# Patient Record
Sex: Female | Born: 1985
Health system: Southern US, Community
[De-identification: ages and names within clinical notes are randomized; demographics above are authoritative.]

## PROBLEM LIST (undated history)

## (undated) DIAGNOSIS — Z803 Family history of malignant neoplasm of breast: Secondary | ICD-10-CM

## (undated) DIAGNOSIS — Z8659 Personal history of other mental and behavioral disorders: Secondary | ICD-10-CM

## (undated) DIAGNOSIS — M9908 Segmental and somatic dysfunction of rib cage: Secondary | ICD-10-CM

---
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
## 2010-06-20 DIAGNOSIS — I8390 Asymptomatic varicose veins of unspecified lower extremity: Secondary | ICD-10-CM | POA: Insufficient documentation

## 2010-07-26 DIAGNOSIS — Z124 Encounter for screening for malignant neoplasm of cervix: Secondary | ICD-10-CM | POA: Insufficient documentation

## 2012-07-08 DIAGNOSIS — R001 Bradycardia, unspecified: Secondary | ICD-10-CM | POA: Insufficient documentation

## 2012-12-24 DIAGNOSIS — K759 Inflammatory liver disease, unspecified: Secondary | ICD-10-CM | POA: Insufficient documentation

## 2013-06-10 LAB — HIV ANTIBODY (ROUTINE TESTING W REFLEX): HIV 1&2 Ab, 4th Generation: NONREACTIVE

## 2016-04-14 LAB — HM PAP SMEAR: HM Pap smear: NORMAL

## 2019-02-13 MED FILL — SERTRALINE HCL 50 MG TABLET: 50 | 30 days supply | Qty: 30 | Fill #0

## 2019-03-20 MED FILL — SERTRALINE HCL 50 MG TABLET: 50 | 30 days supply | Qty: 30 | Fill #1

## 2019-04-26 MED FILL — SERTRALINE HCL 50 MG TABS: 50 | 30 days supply | Qty: 30 | Fill #2

## 2019-05-07 ENCOUNTER — Ambulatory Visit (INDEPENDENT_AMBULATORY_CARE_PROVIDER_SITE_OTHER): Payer: 59 | Admitting: Osteopathic Medicine

## 2019-05-07 ENCOUNTER — Encounter: Payer: Self-pay | Admitting: Osteopathic Medicine

## 2019-05-07 VITALS — BP 102/61 | HR 50 | Temp 98.3°F | Ht 64.5 in | Wt 149.7 lb

## 2019-05-07 DIAGNOSIS — Z803 Family history of malignant neoplasm of breast: Secondary | ICD-10-CM

## 2019-05-07 DIAGNOSIS — M9908 Segmental and somatic dysfunction of rib cage: Secondary | ICD-10-CM

## 2019-05-07 DIAGNOSIS — Z8659 Personal history of other mental and behavioral disorders: Secondary | ICD-10-CM | POA: Insufficient documentation

## 2019-05-07 DIAGNOSIS — D7589 Other specified diseases of blood and blood-forming organs: Secondary | ICD-10-CM | POA: Diagnosis not present

## 2019-05-07 DIAGNOSIS — Z0000 Encounter for general adult medical examination without abnormal findings: Secondary | ICD-10-CM

## 2019-05-07 HISTORY — DX: Personal history of other mental and behavioral disorders: Z86.59

## 2019-05-07 HISTORY — DX: Segmental and somatic dysfunction of rib cage: M99.08

## 2019-05-07 HISTORY — DX: Family history of malignant neoplasm of breast: Z80.3

## 2019-05-07 MED ORDER — SERTRALINE HCL 50 MG PO TABS: 50.0000 mg | ORAL_TABLET | Freq: Every day | ORAL | 3 refills | Status: DC

## 2019-05-07 NOTE — Patient Instructions (Addendum)
General Preventive Care  Most recent routine screening lipids/other labs: ordered today.   Everyone should have blood pressure checked once per year. Goal 130/80 or less.   Tobacco: don't!  Alcohol: responsible moderation is ok for most adults - if you have concerns about your alcohol intake, please talk to me!   Exercise: as tolerated to reduce risk of cardiovascular disease and diabetes. Strength training will also prevent osteoporosis.   Mental health: if need for mental health care (medicines, counseling, other), or concerns about moods, please let me know!   Sexual health: if ever need for STD testing, or if concerns with libido/pain problems, please let me know!  Advanced Directive: Living Will and/or Healthcare Power of Attorney recommended for all adults, regardless of age or health.  Vaccines  Flu vaccine: recommended for almost everyone, every fall.   Shingles vaccine: Shingrix recommended after age 75.   Pneumonia vaccines: Prevnar and Pneumovax recommended after age 92, or sooner if certain medical conditions.  Tetanus booster: Tdap recommended every 10 years. Due 12/2028 Cancer screenings   Colon cancer screening: recommended for everyone at age 62, but some folks need a colonoscopy sooner if risk factors   Breast cancer screening: mammogram recommended at age 74 every other year at least, and annually after age 75.   Cervical cancer screening: Pap every 1 to 5 years depending on age and other risk factors. Can usually stop at age 19 or w/ hysterectomy.   Lung cancer screening: not needed for non-smokers  Infection screenings . HIV, Gonorrhea/Chlamydia: screening as needed . Hepatitis C: recommended for anyone born 01-1966 . TB: certain at-risk populations, or depending on work requirements and/or travel history Other . Bone Density Test: recommended for women at age 63

## 2019-05-07 NOTE — Progress Notes (Signed)
HPI: Olivia Sutton is a 33 y.o. 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  she presents to [facility] today, 05/07/19,  for chief complaint of: New to establish care   Pleasant new patient here to establish care.  No major concerns today.  Works as a Marine scientist, employed by [employer].   Mental health: Takes sertraline 50 mg daily, in need of refill on this medication.  History of musculoskeletal chest pain, takes acetaminophen 500 mg  Well-woman care: Cisgender homosexual/lesbian, G0, married, she and wife have 1yo twins, declines STI testing, monogamous w/ partner.  Reports up-to-date on mammogram and Pap smear.       Past medical, surgical, social and family history reviewed:  Patient Active Problem List   Diagnosis Date Noted  . History of anxiety 05/07/2019  . Somatic dysfunction of rib 05/07/2019  . Family history of breast cancer 05/07/2019    History reviewed. No pertinent surgical history.  Social History   Tobacco Use  . Smoking status: Never Smoker  . Smokeless tobacco: Never Used  Substance Use Topics  . Alcohol use: Yes    Alcohol/week: 1.0 standard drinks    Types: 1 Standard drinks or equivalent per week    Family History  Problem Relation Age of Onset  . High blood pressure Father   . Diabetes Father   . Breast cancer Maternal Grandmother   . Heart attack Maternal Grandfather   . Stroke Paternal Grandfather      Current medication list and allergy/intolerance information reviewed:    Current Outpatient Medications  Medication Sig Dispense Refill  . acetaminophen (TYLENOL) 500 MG tablet Take 500 mg by mouth every 6 (six) hours as needed.    . sertraline (ZOLOFT) 50 MG tablet Take 1 tablet (50 mg total) by mouth daily. 90 tablet 3   No current facility-administered medications for this visit.      Allergies  Allergen Reactions  . Bactrim [Sulfamethoxazole-Trimethoprim] Rash    LFT elevation, fever      Review of Systems:  Constitutional:  No  fever, no chills, No recent illness, No unintentional weight changes. No significant fatigue.   HEENT: No  headache, no vision change, no hearing change, No sore throat, No  sinus pressure  Cardiac: No  chest pain, No  pressure, No palpitations, No  Orthopnea  Respiratory:  No  shortness of breath. No  Cough  Gastrointestinal: No  abdominal pain, No  nausea, No  vomiting,  No  blood in stool, No  diarrhea, No  constipation   Musculoskeletal: +myalgia/arthralgia - pain L rib cage   Skin: No  Rash, No other wounds/concerning lesions  Genitourinary: No  incontinence, No  abnormal genital bleeding, No abnormal genital discharge  Hem/Onc: No  easy bruising/bleeding, No  abnormal lymph node  Endocrine: No cold intolerance,  No heat intolerance. No polyuria/polydipsia/polyphagia   Neurologic: No  weakness, No  dizziness, No  slurred speech/focal weakness/facial droop  Psychiatric: No  concerns with depression, No  concerns with anxiety, No sleep problems, No mood problems  Exam:  BP 102/61 (BP Location: Left Arm, Patient Position: Sitting, Cuff Size: Normal)   Pulse (!) 50   Temp 98.3 F (36.8 C) (Oral)   Ht 5' 4.5" (1.638 m)   Wt 149 lb 11.2 oz (67.9 kg)   BMI 25.30 kg/m   Constitutional: VS see above. General Appearance:  alert, well-developed, well-nourished, NAD  Eyes: Normal lids and conjunctive, non-icteric sclera  Ears, Nose, Mouth, Throat: MMM, Normal external inspection ears/nares/mouth/lips/gums. TM normal bilaterally. Pharynx/tonsils no erythema, no exudate. Nasal mucosa normal.   Neck: No masses, trachea midline. No thyroid enlargement. No tenderness/mass appreciated. No lymphadenopathy  Respiratory: Normal respiratory effort. no wheeze, no rhonchi, no rales  Cardiovascular: S1/S2 normal, no murmur, no  rub/gallop auscultated. RRR. No lower extremity edema.   Gastrointestinal: Nontender, no masses. No hepatomegaly, no splenomegaly. No hernia appreciated. Bowel sounds normal. Rectal exam deferred.   Musculoskeletal: Gait normal. No clubbing/cyanosis of digits.   Neurological: Normal balance/coordination. No tremor.   Skin: warm, dry, intact. No rash/ulcer. No concerning nevi or subq nodules on limited exam.    Psychiatric: Normal judgment/insight. Normal mood and affect. Oriented x3.      ASSESSMENT/PLAN: The primary encounter diagnosis was Annual physical exam. Diagnoses of Somatic dysfunction of rib, History of anxiety, and Family history of breast cancer were also pertinent to this visit.   Orders Placed This Encounter  Procedures  . CBC  . COMPLETE METABOLIC PANEL WITH GFR  . Lipid panel  . TSH    Meds ordered this encounter  Medications  . sertraline (ZOLOFT) 50 MG tablet    Sig: Take 1 tablet (50 mg total) by mouth daily.    Dispense:  90 tablet    Refill:  3    Patient Instructions  General Preventive Care  Most recent routine screening lipids/other labs: ordered today.   Everyone should have blood pressure checked once per year. Goal 130/80 or less.   Tobacco: don't!  Alcohol: responsible moderation is ok for most adults - if you have concerns about your alcohol intake, please talk to me!   Exercise: as tolerated to reduce risk of cardiovascular disease and diabetes. Strength training will also prevent osteoporosis.   Mental health: if need for mental health care (medicines, counseling, other), or concerns about moods, please let me know!   Sexual health: if ever need for STD testing, or if concerns with libido/pain problems, please let me know!  Advanced Directive: Living Will and/or Healthcare Power of Attorney recommended for all adults, regardless of age or health.  Vaccines  Flu vaccine: recommended for almost everyone, every fall.   Shingles  vaccine: Shingrix recommended after age 37.   Pneumonia vaccines: Prevnar and Pneumovax recommended after age 43, or sooner if certain medical conditions.  Tetanus booster: Tdap recommended every 10 years. Due 12/2028 Cancer screenings   Colon cancer screening: recommended for everyone at age 71, but some folks need a colonoscopy sooner if risk factors   Breast cancer screening: mammogram recommended at age 79 every other year at least, and annually after age 63.   Cervical cancer screening: Pap every 1 to 5 years depending on age and other risk factors. Can usually stop at age 75 or w/ hysterectomy.   Lung cancer screening: not needed for non-smokers  Infection screenings . HIV, Gonorrhea/Chlamydia: screening as needed . Hepatitis C: recommended for anyone born 40-1967 . TB: certain at-risk populations, or depending on work requirements and/or travel history Other . Bone Density Test: recommended for women at age 15         Visit summary with medication list and pertinent instructions was printed for patient to review. All questions at time of visit were answered - patient instructed to contact office with any additional concerns or updates. ER/RTC precautions were reviewed with the patient.     Please  note: voice recognition software was used to produce this document, and typos may escape review. Please contact Dr. Sheppard Coil for any needed clarifications.     Follow-up plan: Return in about 1 year (around 05/06/2020) for annual physical w/ Pap, sooner if needed / as needed for osteopathic treatments .

## 2019-05-09 LAB — COMPLETE METABOLIC PANEL WITH GFR
AG Ratio: 1.9 (calc) (ref 1.0–2.5)
ALT: 9 U/L (ref 6–29)
AST: 13 U/L (ref 10–30)
Albumin: 4.4 g/dL (ref 3.6–5.1)
Alkaline phosphatase (APISO): 40 U/L (ref 31–125)
BUN: 16 mg/dL (ref 7–25)
CO2: 30 mmol/L (ref 20–32)
Calcium: 9.3 mg/dL (ref 8.6–10.2)
Chloride: 105 mmol/L (ref 98–110)
Creat: 0.69 mg/dL (ref 0.50–1.10)
GFR, Est African American: 131 mL/min/{1.73_m2} (ref >=60)
GFR, Est Non African American: 113 mL/min/{1.73_m2} (ref >=60)
Globulin: 2.3 g/dL (calc) (ref 1.9–3.7)
Glucose, Bld: 57 mg/dL — ABNORMAL LOW (ref 65–99)
Potassium: 4.2 mmol/L (ref 3.5–5.3)
Sodium: 141 mmol/L (ref 135–146)
Total Bilirubin: 0.4 mg/dL (ref 0.2–1.2)
Total Protein: 6.7 g/dL (ref 6.1–8.1)

## 2019-05-09 LAB — CBC
HCT: 39.1 % (ref 35.0–45.0)
Hemoglobin: 12.9 g/dL (ref 11.7–15.5)
MCH: 33.3 pg — ABNORMAL HIGH (ref 27.0–33.0)
MCHC: 33 g/dL (ref 32.0–36.0)
MCV: 101 fL — ABNORMAL HIGH (ref 80.0–100.0)
MPV: 11.3 fL (ref 7.5–12.5)
Platelets: 219 10*3/uL (ref 140–400)
RBC: 3.87 10*6/uL (ref 3.80–5.10)
RDW: 11.4 % (ref 11.0–15.0)
WBC: 6 10*3/uL (ref 3.8–10.8)

## 2019-05-09 LAB — LIPID PANEL
Cholesterol: 133 mg/dL (ref <200)
HDL: 57 mg/dL (ref >=50)
LDL Cholesterol (Calc): 67 mg/dL (calc)
Non-HDL Cholesterol (Calc): 76 mg/dL (calc) (ref <130)
Total CHOL/HDL Ratio: 2.3 (calc) (ref <5.0)
Triglycerides: 31 mg/dL (ref <150)

## 2019-05-09 LAB — TSH: TSH: 0.68 mIU/L

## 2019-05-09 LAB — VITAMIN B12: Vitamin B-12: 362 pg/mL (ref 200–1100)

## 2019-05-09 LAB — FOLATE: Folate: 7.9 ng/mL

## 2019-05-12 NOTE — Addendum Note (Signed)
Addended by: Maryla Morrow on: 05/12/2019 04:00 PM   Modules accepted: Orders

## 2019-06-25 MED FILL — SERTRALINE HCL 50 MG TABLET: 50 | 30 days supply | Qty: 30 | Fill #3

## 2019-07-31 MED FILL — SERTRALINE HCL 50 MG TABLET: 50 | 30 days supply | Qty: 30 | Fill #4

## 2019-08-12 ENCOUNTER — Encounter: Payer: Self-pay | Admitting: Osteopathic Medicine

## 2019-08-12 NOTE — Telephone Encounter (Signed)
Doesn't sound like anything serious but virtual visit is ok to set up.

## 2019-08-13 ENCOUNTER — Telehealth: Payer: Self-pay

## 2019-08-13 NOTE — Telephone Encounter (Signed)
Pt called requesting to r/s her appt she cancelled 08/14/19.  Pls contact pt for scheduling. Thanks.

## 2019-08-14 ENCOUNTER — Ambulatory Visit: Payer: 59 | Admitting: Osteopathic Medicine

## 2019-08-14 NOTE — Telephone Encounter (Signed)
Appointment has been canceled and a new one has been made.

## 2019-08-20 ENCOUNTER — Ambulatory Visit (INDEPENDENT_AMBULATORY_CARE_PROVIDER_SITE_OTHER): Payer: 59 | Admitting: Family Medicine

## 2019-08-20 ENCOUNTER — Encounter: Payer: Self-pay | Admitting: Family Medicine

## 2019-08-20 ENCOUNTER — Other Ambulatory Visit: Payer: Self-pay

## 2019-08-20 ENCOUNTER — Ambulatory Visit (INDEPENDENT_AMBULATORY_CARE_PROVIDER_SITE_OTHER): Payer: 59

## 2019-08-20 VITALS — BP 100/68 | HR 52 | Temp 98.5°F | Wt 148.0 lb

## 2019-08-20 DIAGNOSIS — R0789 Other chest pain: Secondary | ICD-10-CM | POA: Diagnosis not present

## 2019-08-20 DIAGNOSIS — Z23 Encounter for immunization: Secondary | ICD-10-CM

## 2019-08-20 DIAGNOSIS — R1012 Left upper quadrant pain: Secondary | ICD-10-CM

## 2019-08-20 DIAGNOSIS — R079 Chest pain, unspecified: Secondary | ICD-10-CM | POA: Diagnosis not present

## 2019-08-20 DIAGNOSIS — R05 Cough: Secondary | ICD-10-CM | POA: Diagnosis not present

## 2019-08-20 MED ORDER — OMEPRAZOLE 40 MG PO CPDR: 40.0000 mg | DELAYED_RELEASE_CAPSULE | Freq: Every day | ORAL | 3 refills | Status: DC

## 2019-08-20 MED ORDER — SUCRALFATE 1 G PO TABS: 1.0000 g | ORAL_TABLET | Freq: Four times a day (QID) | ORAL | 0 refills | Status: DC

## 2019-08-20 MED FILL — SUCRALFATE 1 GM TABLET: 1 | 30 days supply | Qty: 120 | Fill #0

## 2019-08-20 MED FILL — OMEPRAZOLE 40 MG CPDR: 40 | 90 days supply | Qty: 90 | Fill #0

## 2019-08-20 NOTE — Progress Notes (Signed)
Olivia Sutton is a 33 y.o. female who presents to [facility] today for chest and upper abdominal pain.  Patient has a 2-year history of left-sided chest pain.  She had initial work-up for this in [place].  She had chest x-ray and labs and H. pylori evaluation it was all normal.  She was thought to have costochondritis.  This was somewhat lost to follow-up.  She notes since then she has had persistent left-sided chest pain that is interfering with her ability to exercise.  She cannot run any longer and notes that it is somewhat obnoxious.  This was manageable and a bit more mild.  However about 2 weeks ago she developed worsening left-sided chest pain.  She notes the pain is now more located in the left upper quadrant of her abdomen and radiates to her scapula on the left side.  This is worse with prolonged sitting and better with motion and activity.  She is not sure if food changes her pain.  She notes that she did start a modified ketogenic diet about 4 months ago and is not sure if that has anything to do with her pain now.   She has a pertinent family history for polymyalgia rheumatica and rheumatoid arthritis in her father.  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    ROS as above:  Exam:  BP 100/68   Pulse (!) 52   Temp 98.5 F (36.9 C) (Oral)   Wt 148 lb (67.1 kg)   BMI 25.01 kg/m  Wt Readings from Last 5 Encounters:  08/20/19 148 lb (67.1 kg)  05/07/19 149 lb 11.2 oz (67.9 kg)    Gen: Well NAD HEENT: EOMI,  MMM Lungs: Normal work of breathing. CTABL Heart: Mild bradycardia no MRG Abd: NABS, Soft. Nondistended, Nontender no masses.  No rebound or guarding. Exts: Brisk capillary refill, warm and well perfused.  Chest wall nontender.  Normal scapular motion.  Lab and Radiology Results Two-view chest  x-ray images obtained today personally independently reviewed No acute infiltrates.  Relatively normal chest x-ray.  Tiny nodular density present left lung field adjacent to the left heart border.  Seen on AP and lateral view. Await formal radiology review  Twelve-lead EKG shows sinus bradycardia at 57 bpm.  No ST segment elevation or depression.  QTc 389.  Normal EKG. .    MRI CHEST WITHOUT CONTRAST12/04/2018 [facility] Result Impression  IMPRESSION: No abnormality demonstrated within the chest wall.  No abnormally-sized lymph nodes seen.  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     Dictated by: Pauline Aus MD ELECTRONICALLY SIGNED ON: 11/01/2018  Result Narrative  Patient Name: Olivia Sutton Date of Birth: 1986-03-05  Exam Date: 10/31/2018 Performed at: [facility] [id number]     EXAM: MRI CHEST WITHOUT CONTRAST  HISTORY: Left-sided chest pain.  TECHNIQUE: Coronal T1, coronal inversion recovery T2, sagittal T2, axial T1, axial STIR images are obtained. The area the patient's pain was marked seen to best advantage on axial images 12 and 13 on series 8.  COMPARISON: None available.  FINDINGS: There are no abnormally sized lymph nodes demonstrated.  Imaging of the visualized chest wall demonstrates no evidence of abnormality. No bone abnormality is demonstrated. Limited imaging of the upper abdomen demonstrates a high signal intensity lesion seen arising from the lateral aspect of the spleen  measuring 2.4 cm  in maximal dimension. This is isointense to fluid on the T1-weighted images and is likely on the basis of a splenic cyst although incompletely evaluated without intravenous contrast. Correlation with ultrasound may provide additional  information.  The limited imaging of the remainder of the upper abdomen appears unremarkable.     Assessment and Plan: 33 y.o. female with  Upper left quadrant abdominal  pain/left-sided chest pain.  Etiology at this time is unclear.  Plan for broad work-up including chest x-ray abdominal ultrasound and metabolic and rheumatologic assessment listed below.  EKG unremarkable today.  Plan to start empiric treatment with omeprazole and Carafate for possible gastritis.  Recheck back in 4 weeks.  Proceed with further work-up as needed.    Flu vaccine given today prior to discharge.  PDMP not reviewed this encounter. Orders Placed This Encounter  Procedures  . DG Chest 2 View    Order Specific Question:   Reason for exam:    Answer:   Cough, assess intra-thoracic pathology    Order Specific Question:   Is the patient pregnant?    Answer:   No    Order Specific Question:   Preferred imaging location?    Answer:   [facility]  . US Abdomen Complete    Standing Status:   Future    Standing Expiration Date:   10/19/2020    Order Specific Question:   Reason for Exam (SYMPTOM  OR DIAGNOSIS REQUIRED)    Answer:   eval LUQ ABD pain. Elevted MCV    Order Specific Question:   Preferred imaging location?    Answer:   [facility]  . Flu Vaccine QUAD 36+ mos IM  . CBC with Differential/Platelet  . Amylase  . Lipase  . COMPLETE METABOLIC PANEL WITH GFR  . Sedimentation rate  . Rheumatoid factor  . Cyclic citrul peptide antibody, IgG  . ANA  . CK  . Ambulatory referral to Physical Therapy    Referral Priority:   Routine    Referral Type:   Physical Medicine    Referral Reason:   Specialty Services Required    Requested Specialty:   Physical Therapy  . EKG 12-Lead   Meds ordered this encounter  Medications  . omeprazole (PRILOSEC) 40 MG capsule    Sig: Take 1 capsule (40 mg total) by mouth daily.    Dispense:  30 capsule    Refill:  3  . sucralfate (CARAFATE) 1 g tablet    Sig: Take 1 tablet (1 g total) by mouth 4 (four) times daily.    Dispense:  120 tablet    Refill:  0     Historical information moved to improve visibility of  documentation.  Past Medical History:  Diagnosis Date  . Family history of breast cancer 05/07/2019   Multiple relatives on maternal side, BRCA negative, pt's sister (-)BRCA   . History of anxiety 05/07/2019  . Somatic dysfunction of rib 05/07/2019   No past surgical history on file. Social History   Tobacco Use  . Smoking status: Never Smoker  . Smokeless tobacco: Never Used  Substance Use Topics  . Alcohol use: Yes    Alcohol/week: 1.0 standard drinks    Types: 1 Standard drinks or equivalent per week   family history includes Breast cancer in her maternal grandmother; Diabetes in her father; Heart attack in her maternal grandfather; High blood pressure in her father; Stroke in her paternal grandfather.  Medications: Current Outpatient Medications  Medication Sig Dispense Refill  .  acetaminophen (TYLENOL) 500 MG tablet Take 500 mg by mouth every 6 (six) hours as needed.    Marland Kitchen omeprazole (PRILOSEC) 40 MG capsule Take 1 capsule (40 mg total) by mouth daily. 30 capsule 3  . sertraline (ZOLOFT) 50 MG tablet Take 1 tablet (50 mg total) by mouth daily. 90 tablet 3  . sucralfate (CARAFATE) 1 g tablet Take 1 tablet (1 g total) by mouth 4 (four) times daily. 120 tablet 0   No current facility-administered medications for this visit.    Allergies  Allergen Reactions  . Bactrim [Sulfamethoxazole-Trimethoprim] Rash    LFT elevation, fever     Discussed warning signs or symptoms. Please see discharge instructions. Patient expresses understanding.

## 2019-08-20 NOTE — Patient Instructions (Signed)
Thank you for coming in today. Take omeprazole daily for 1 month.  Take carafate 4x daily for 1 week.  Attend PT.  Check back in 4 weeks.  Keep me updated.   Good luck with NP position.    Abdominal Pain, Adult  Many things can cause belly (abdominal) pain. Most times, belly pain is not dangerous. Many cases of belly pain can be watched and treated at home. Sometimes belly pain is serious, though. Your doctor will try to find the cause of your belly pain. Follow these instructions at home:  Take over-the-counter and prescription medicines only as told by your doctor. Do not take medicines that help you poop (laxatives) unless told to by your doctor.  Drink enough fluid to keep your pee (urine) clear or pale yellow.  Watch your belly pain for any changes.  Keep all follow-up visits as told by your doctor. This is important. Contact a doctor if:  Your belly pain changes or gets worse.  You are not hungry, or you lose weight without trying.  You are having trouble pooping (constipated) or have watery poop (diarrhea) for more than 2-3 days.  You have pain when you pee or poop.  Your belly pain wakes you up at night.  Your pain gets worse with meals, after eating, or with certain foods.  You are throwing up and cannot keep anything down.  You have a fever. Get help right away if:  Your pain does not go away as soon as your doctor says it should.  You cannot stop throwing up.  Your pain is only in areas of your belly, such as the right side or the left lower part of the belly.  You have bloody or black poop, or poop that looks like tar.  You have very bad pain, cramping, or bloating in your belly.  You have signs of not having enough fluid or water in your body (dehydration), such as: ? Dark pee, very little pee, or no pee. ? Cracked lips. ? Dry mouth. ? Sunken eyes. ? Sleepiness. ? Weakness. This information is not intended to replace advice given to you by your  health care provider. Make sure you discuss any questions you have with your health care provider. Document Released: 05/01/2008 Document Revised: 06/02/2016 Document Reviewed: 04/26/2016 Elsevier Interactive Patient Education  [employer].

## 2019-08-22 LAB — COMPLETE METABOLIC PANEL WITH GFR
AG Ratio: 1.9 (calc) (ref 1.0–2.5)
ALT: 10 U/L (ref 6–29)
AST: 13 U/L (ref 10–30)
Albumin: 4.5 g/dL (ref 3.6–5.1)
Alkaline phosphatase (APISO): 38 U/L (ref 31–125)
BUN: 15 mg/dL (ref 7–25)
CO2: 25 mmol/L (ref 20–32)
Calcium: 9.5 mg/dL (ref 8.6–10.2)
Chloride: 103 mmol/L (ref 98–110)
Creat: 0.58 mg/dL (ref 0.50–1.10)
GFR, Est African American: 138 mL/min/{1.73_m2} (ref >=60)
GFR, Est Non African American: 119 mL/min/{1.73_m2} (ref >=60)
Globulin: 2.4 g/dL (calc) (ref 1.9–3.7)
Glucose, Bld: 90 mg/dL (ref 65–99)
Potassium: 4.4 mmol/L (ref 3.5–5.3)
Sodium: 138 mmol/L (ref 135–146)
Total Bilirubin: 0.5 mg/dL (ref 0.2–1.2)
Total Protein: 6.9 g/dL (ref 6.1–8.1)

## 2019-08-22 LAB — CBC WITH DIFFERENTIAL/PLATELET
Absolute Monocytes: 500 cells/uL (ref 200–950)
Basophils Absolute: 39 cells/uL (ref 0–200)
Basophils Relative: 0.8 %
Eosinophils Absolute: 279 cells/uL (ref 15–500)
Eosinophils Relative: 5.7 %
HCT: 41 % (ref 35.0–45.0)
Hemoglobin: 13.6 g/dL (ref 11.7–15.5)
Lymphs Abs: 1475 cells/uL (ref 850–3900)
MCH: 33.1 pg — ABNORMAL HIGH (ref 27.0–33.0)
MCHC: 33.2 g/dL (ref 32.0–36.0)
MCV: 99.8 fL (ref 80.0–100.0)
MPV: 10.9 fL (ref 7.5–12.5)
Monocytes Relative: 10.2 %
Neutro Abs: 2607 cells/uL (ref 1500–7800)
Neutrophils Relative %: 53.2 %
Platelets: 257 10*3/uL (ref 140–400)
RBC: 4.11 10*6/uL (ref 3.80–5.10)
RDW: 11.6 % (ref 11.0–15.0)
Total Lymphocyte: 30.1 %
WBC: 4.9 10*3/uL (ref 3.8–10.8)

## 2019-08-22 LAB — AMYLASE: Amylase: 42 U/L (ref 21–101)

## 2019-08-22 LAB — CK: Total CK: 27 U/L — ABNORMAL LOW (ref 29–143)

## 2019-08-22 LAB — ANA: Anti Nuclear Antibody (ANA): NEGATIVE

## 2019-08-22 LAB — CYCLIC CITRUL PEPTIDE ANTIBODY, IGG: Cyclic Citrullin Peptide Ab: <16 UNITS

## 2019-08-22 LAB — LIPASE: Lipase: 11 U/L (ref 7–60)

## 2019-08-22 LAB — RHEUMATOID FACTOR: Rheumatoid fact SerPl-aCnc: <14 IU/mL (ref <14)

## 2019-08-22 LAB — SEDIMENTATION RATE: Sed Rate: 6 mm/h (ref 0–20)

## 2019-08-26 ENCOUNTER — Ambulatory Visit (INDEPENDENT_AMBULATORY_CARE_PROVIDER_SITE_OTHER): Payer: 59

## 2019-08-26 ENCOUNTER — Encounter: Payer: Self-pay | Admitting: Family Medicine

## 2019-08-26 ENCOUNTER — Other Ambulatory Visit: Payer: Self-pay

## 2019-08-26 DIAGNOSIS — R1012 Left upper quadrant pain: Secondary | ICD-10-CM

## 2019-08-26 DIAGNOSIS — Z23 Encounter for immunization: Secondary | ICD-10-CM | POA: Diagnosis not present

## 2019-08-26 DIAGNOSIS — R0789 Other chest pain: Secondary | ICD-10-CM

## 2019-08-28 ENCOUNTER — Encounter: Payer: Self-pay | Admitting: Osteopathic Medicine

## 2019-09-09 ENCOUNTER — Encounter: Payer: Self-pay | Admitting: Osteopathic Medicine

## 2019-09-17 ENCOUNTER — Ambulatory Visit (INDEPENDENT_AMBULATORY_CARE_PROVIDER_SITE_OTHER): Payer: 59 | Admitting: Osteopathic Medicine

## 2019-09-17 ENCOUNTER — Encounter: Payer: Self-pay | Admitting: Osteopathic Medicine

## 2019-09-17 ENCOUNTER — Other Ambulatory Visit: Payer: Self-pay

## 2019-09-17 VITALS — BP 105/70 | HR 51 | Temp 98.1°F | Wt 148.0 lb

## 2019-09-17 DIAGNOSIS — M9908 Segmental and somatic dysfunction of rib cage: Secondary | ICD-10-CM | POA: Diagnosis not present

## 2019-09-17 DIAGNOSIS — R0789 Other chest pain: Secondary | ICD-10-CM | POA: Diagnosis not present

## 2019-09-17 DIAGNOSIS — R1012 Left upper quadrant pain: Secondary | ICD-10-CM | POA: Diagnosis not present

## 2019-09-17 MED ORDER — SERTRALINE HCL 50 MG PO TABS: 50.0000 mg | ORAL_TABLET | Freq: Every day | ORAL | 3 refills | Status: DC

## 2019-09-17 MED FILL — SERTRALINE HCL 50 MG TABLET: 50 | 90 days supply | Qty: 90 | Fill #0

## 2019-09-17 NOTE — Progress Notes (Signed)
HPI: Olivia Sutton is a 33 y.o. 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  she presents to [facility] today, 09/17/19,  for chief complaint of:  Follow-up left-sided pain  Recently seen by a colleague of mine while I was out of the office.  There had been some issues with longstanding left-sided chest/rib pain, patient was also developing some abdominal pain as well.  See Dr. Clovis Riley note for full details, he started on omeprazole and Carafate, patient has noted improvement in symptoms though the chronic chest/rib pain still persists.  She has not yet scheduled an appointment with physical therapy for this issue   At today's visit 09/17/19 ... PMH, PSH, FH reviewed and updated as needed.  Current medication list and allergy/intolerance hx reviewed and updated as needed. (See remainder of HPI, ROS, Phys Exam below)   No results found.  No results found for this or any previous visit (from the past 72 hour(s)).        ASSESSMENT/PLAN: The primary encounter diagnosis was Atypical chest pain. Diagnoses of LUQ abdominal pain and Somatic dysfunction of rib were also pertinent to this visit.   No red flags for cardiac symptoms, symptoms have been stable for years as far as the chest/rib pain are concerned and she had responded well to some sort of chiropractic manipulations in the past.  We tried OMT at last visit which was not very helpful.  We will see if physical therapy has anything to add to this, it certainly does not sound cardiac but may consider cardiology referral just for reassurance.  Denies can discontinue the omeprazole and Carafate, can repeat this treatment in the future as needed  Patient has some time today, will walk down to physical therapy office and see if she can get herself on the schedule    Follow-up  plan: Return for ANNUAL (call week prior to visit for lab orders) no later than 04/2020, will get Pap at that visit. . See me sooner if needed!                                                 ################################################# ################################################# ################################################# #################################################    Current Meds  Medication Sig  . acetaminophen (TYLENOL) 500 MG tablet Take 500 mg by mouth every 6 (six) hours as needed.  Marland Kitchen omeprazole (PRILOSEC) 40 MG capsule Take 1 capsule (40 mg total) by mouth daily.  . sertraline (ZOLOFT) 50 MG tablet Take 1 tablet (50 mg total) by mouth daily.  . sucralfate (CARAFATE) 1 g tablet Take 1 tablet (1 g total) by mouth 4 (four) times daily.  . [DISCONTINUED] sertraline (ZOLOFT) 50 MG tablet Take 1 tablet (50 mg total) by mouth daily.    Allergies  Allergen Reactions  . Sulfamethoxazole-Trimethoprim Rash    LFT elevation, fever Drug-induced hepatitis Elevated liver enzymes, rash, fever        Review of Systems:  Constitutional: No recent illness  Cardiac: See HPI re: chest pain, No  pressure, No palpitations  Respiratory:  No  shortness of breath. No  Cough  Gastrointestinal: No  abdominal pain, no change on bowel habits  Musculoskeletal: No new myalgia/arthralgia  Neurologic: No  weakness, No  Dizziness   Exam:  BP 105/70 (BP Location: Left Arm, Patient Position: Sitting, Cuff Size: Normal)   Pulse (!) 51   Temp 98.1 F (36.7 C) (Oral)   Wt 148 lb 0.6 oz (67.2 kg)   BMI 25.02 kg/m   Constitutional: VS see above. General Appearance: alert, well-developed, well-nourished, NAD  Neck: No masses, trachea midline.   Respiratory: Normal respiratory effort. no wheeze, no rhonchi, no rales  Cardiovascular: S1/S2 normal, no murmur, no rub/gallop auscultated. RRR.   Musculoskeletal: Gait normal.  Symmetric and independent movement of all extremities.  Normal rib excursion with deep inhalation symmetrically  Abdominal: non-tender, non-distended, no appreciable organomegaly, neg Murphy's, BS WNLx4  Neurological: Normal balance/coordination. No tremor.  Skin: warm, dry, intact.   Psychiatric: Normal judgment/insight. Normal mood and affect. Oriented x3.       Visit summary with medication list and pertinent instructions was printed for patient to review, patient was advised to alert [place] if any updates are needed. All questions at time of visit were answered - patient instructed to contact office with any additional concerns. ER/RTC precautions were reviewed with the patient and understanding verbalized.   Note: Total time spent 15 minutes, greater than 50% of the visit was spent face-to-face counseling and coordinating care for the following: The primary encounter diagnosis was Atypical chest pain. Diagnoses of LUQ abdominal pain and Somatic dysfunction of rib were also pertinent to this visit.Marland Kitchen  Please note: voice recognition software was used to produce this document, and typos may escape review. Please contact Dr. Sheppard Coil for any needed clarifications.    Follow up plan: Return for ANNUAL (call week prior to visit for lab orders) no later than 04/2020, will get Pap at that visit. Marland Kitchen

## 2019-09-24 ENCOUNTER — Other Ambulatory Visit: Payer: Self-pay

## 2019-09-24 ENCOUNTER — Ambulatory Visit (INDEPENDENT_AMBULATORY_CARE_PROVIDER_SITE_OTHER): Payer: 59 | Admitting: Rehabilitative and Restorative Service Providers"

## 2019-09-24 DIAGNOSIS — M25512 Pain in left shoulder: Secondary | ICD-10-CM

## 2019-09-24 DIAGNOSIS — G8929 Other chronic pain: Secondary | ICD-10-CM

## 2019-09-24 DIAGNOSIS — R293 Abnormal posture: Secondary | ICD-10-CM | POA: Diagnosis not present

## 2019-09-24 DIAGNOSIS — M6281 Muscle weakness (generalized): Secondary | ICD-10-CM

## 2019-09-24 NOTE — Therapy (Signed)
[place], [place], [address] Phone: [phone]   Fax:  [phone]  Physical Therapy Evaluation  Patient Details  Name: Olivia Sutton MRN: [id number] Date of Birth: 1986/11/22 Referring Provider (PT): Gregor Hams, MD   Encounter Date: 09/24/2019  PT End of Session - 09/24/19 1252    Visit Number  1    Number of Visits  12    Date for PT Re-Evaluation  11/08/19    Authorization Type  [place] save plan    PT Start Time  0845    PT Stop Time  0930    PT Time Calculation (min)  45 min       Past Medical History:  Diagnosis Date  . Family history of breast cancer 05/07/2019   Multiple relatives on maternal side, BRCA negative, pt's sister (-)BRCA   . History of anxiety 05/07/2019  . Somatic dysfunction of rib 05/07/2019    No past surgical history on file.  There were no vitals filed for this visit.   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  She used to run and do 1/2 marathons, but due to pain, she is not able.  She has had some manipulations in the past of her ribs that helped temporarily.  Pain varies where it can be consistent for 2 weeks and other times it comes and goes.  When pain is increased, it does impact her sleep.  She reports onset was related to a heavy work out in plank position with lifting weights.    Patient Stated Goals  return to prior physical activity level    Currently in Pain?  Yes    Pain Score  3    goes up to 7/10   Pain Location  Rib cage    Pain Descriptors / Indicators  Aching   can get sharp with running or rigorous pain   Pain Type  Chronic pain    Pain Radiating Towards  radiates into chest and upper back; begins laterally    Pain Onset  More than a month ago    Pain Frequency  Constant    Aggravating Factors   lifting, running    Pain  Relieving Factors  rest    Effect of Pain on Daily Activities  limits daily activities-- she completes, but does note pain         [facility] PT Assessment - 09/24/19 0855      Assessment   Medical Diagnosis  R10.12 (ICD-10-CM) - LUQ abdominal pain    Referring Provider (PT)  Gregor Hams, MD    Onset Date/Surgical Date  --   July 2018   Hand Dominance  Right    Prior Therapy  none      Precautions   Precautions  None      Balance Screen   Has the patient fallen in the past 6 months  No    Has the patient had a decrease in activity level because of a fear of falling?   No    Is the patient reluctant to leave their home because of a fear of falling?   No      Home Social worker  Private residence    Living Arrangements  Spouse/significant other;Children      Prior Function   Level of Independence  Independent  Leisure  Used to run, work out more rigorous      Observation/Other Assessments   Focus on Therapeutic Outcomes (FOTO)   51%      Sensation   Light Touch  Appears Intact    Additional Comments  Gets burning sensation from rib into anterior chest and then raditating into upper back      Posture/Postural Control   Posture/Postural Control  Postural limitations      ROM / Strength   AROM / PROM / Strength  AROM;Strength      AROM   Overall AROM Comments  Has pain when abducting UEs noting pain (not tightness); R trunk rotation increases pain in L ribs radiating into chest; extension of spine dec's symptoms, R sidebending provokes a 5/10 pain in lateral L trunk region.      Strength   Overall Strength Comments  L shoulder flexion 5/5, L shoulder abduction, L elbow flexion 5/5, L elbow extension 5/5, L anterior chest *pain and 4/5, L serratus 4/5 with pain      Flexibility   Soft Tissue Assessment /Muscle Length  --      Palpation   Palpation comment  Tenderness and palpable tightness in L subscapularis, latissimus, and intercostal musculature.       Special Tests   Other special tests  Spring test throughout spine reproduces anterior chest pain in mid thoracic region.                Objective measurements completed on examination: See above findings.      [facility] Adult PT Treatment/Exercise - 09/24/19 0855      Exercises   Exercises  Shoulder      Shoulder Exercises: Prone   Other Prone Exercises  Quadriped cat/camel x 10 reps, alternating UE reaching in quadriped x 5 reps.  Attempted transition from child's pose stretch into up dog position however this was painful so modified.             PT Education - 09/24/19 1241    Education Details  HEP established    Person(s) Educated  Patient    Methods  Explanation;Demonstration;Handout    Comprehension  Verbalized understanding;Returned demonstration       PT Short Term Goals - 09/24/19 1253      PT SHORT TERM GOAL #1   Title  The patient will be indep with HEP for trunk stretching, L shoulder stability, upper back stabilization.    Time  4    Period  Weeks    Target Date  10/24/19      PT SHORT TERM GOAL #2   Title  The patient will tolerate UE weight bearing position (extended plank) x 30 seconds to demonstrate improved strength.    Time  4    Period  Weeks    Target Date  10/24/19      PT SHORT TERM GOAL #3   Title  The patient will verbalize understanding of proper body mechanics for lifting toddlers and lifting overhead.    Time  4    Period  Weeks    Target Date  10/24/19        PT Long Term Goals - 09/24/19 1256      PT LONG TERM GOAL #1   Title  The patient will be independent with progression of HEP for post d/c.    Time  6    Period  Weeks    Target Date  11/08/19      PT  LONG TERM GOAL #2   Title  The patient will tolerate AROM to 170 degrees for flexion and abduction with L shoulder without increase in pain from baseline.    Time  6    Period  Weeks    Target Date  11/08/19      PT LONG TERM GOAL #3   Title  The patient  will tolerate jogging x 10 minutes per report without increase in pain from baseline.    Time  6    Period  Weeks    Target Date  11/08/19      PT LONG TERM GOAL #4   Title  The patient will improve FOTO score from 51% to > or equal to 65% to demonstrate dec'd disability from pain.    Time  6    Period  Weeks    Target Date  11/08/19             Plan - 09/24/19 1241    Clinical Impression Statement  The patient is a 33 year old female presenting with 2+ year history of L lateral trunk pain (in region of serratus anterior) that radiates anteriorly into chest and posteriorly into upper back/shoulder blade.  She presents with pain with AROM, weakness in serratus anterior, pain with loading of the L shoulder that radiates into the chest/back, pain with thoracic spring test that radiates into the chest, and overall decrease in tolerance to lifting at home and participating in more rigorous exercise.  PT to address deficits and focus on return to wellness program as patient is able to tolerate.    Personal Factors and Comorbidities  Time since onset of injury/illness/exacerbation    Examination-Activity Limitations  Lift;Reach Overhead    Examination-Participation Restrictions  Cleaning    Stability/Clinical Decision Making  Stable/Uncomplicated    Clinical Decision Making  Low    Rehab Potential  Good    PT Frequency  2x / week    PT Duration  6 weeks    PT Treatment/Interventions  ADLs/Self Care Home Management;Cryotherapy;Electrical Stimulation;Iontophoresis 11m/ml Dexamethasone;Moist Heat;Ultrasound;Therapeutic exercise;Therapeutic activities;Neuromuscular re-education;Patient/family education;Dry needling;Passive range of motion;Manual techniques;Joint Manipulations;Spinal Manipulations    PT Next Visit Plan  Check tolerance to HEP; dry needling in L parascapular region, progression of strengthening.  Ultimate goal is to return to jogging and working out.    PT Home Exercise Plan   [email or web address]    Consulted and Agree with Plan of Care  Patient       Patient will benefit from skilled therapeutic intervention in order to improve the following deficits and impairments:  Pain, Decreased range of motion, Increased fascial restricitons, Impaired flexibility  Visit Diagnosis: Muscle weakness (generalized)  Abnormal posture  Chronic left shoulder pain     Problem List Patient Active Problem List   Diagnosis Date Noted  . History of anxiety 05/07/2019  . Somatic dysfunction of rib 05/07/2019  . Family history of breast cancer 05/07/2019    [place],PT 09/24/2019, 1:07 PM  [facility] [address]Phone: [phone]  Fax:  [phone] Name: Olivia RoddMRN: [id number] of Birth: 3Sep 26, 1987

## 2019-09-24 NOTE — Patient Instructions (Signed)
Access Code: [email or web address]  URL: [email or web address]  Date: 09/24/2019  Prepared by: Rudell Cobb   Program Notes  Begin at 1 time per day and increase as long as you are tolerating these well.   Exercises Cat-Camel - 10 reps - 2 sets - 5 seconds hold - 2x daily - 7x weekly Quadruped Alternating Arm Lift - 5 reps - 2 sets - 5 seconds hold - 2x daily - 7x weekly Patient Education Trigger Point Dry Needling

## 2019-09-29 ENCOUNTER — Encounter: Payer: 59 | Admitting: Physical Therapy

## 2019-09-30 ENCOUNTER — Ambulatory Visit (INDEPENDENT_AMBULATORY_CARE_PROVIDER_SITE_OTHER): Payer: 59 | Admitting: Physical Therapy

## 2019-09-30 ENCOUNTER — Encounter: Payer: Self-pay | Admitting: Physical Therapy

## 2019-09-30 ENCOUNTER — Other Ambulatory Visit: Payer: Self-pay

## 2019-09-30 DIAGNOSIS — M6281 Muscle weakness (generalized): Secondary | ICD-10-CM | POA: Diagnosis not present

## 2019-09-30 DIAGNOSIS — R293 Abnormal posture: Secondary | ICD-10-CM

## 2019-09-30 DIAGNOSIS — M25512 Pain in left shoulder: Secondary | ICD-10-CM

## 2019-09-30 DIAGNOSIS — G8929 Other chronic pain: Secondary | ICD-10-CM

## 2019-09-30 NOTE — Therapy (Signed)
[place], [place], [address] Phone: [phone]   Fax:  [phone]  Physical Therapy Treatment  Patient Details  Name: Olivia Sutton MRN: [id number] Date of Birth: May 18, 1986 Referring Provider (PT): Gregor Hams, MD   Encounter Date: 09/30/2019  PT End of Session - 09/30/19 0849    Visit Number  2    Number of Visits  12    Date for PT Re-Evaluation  11/08/19    Authorization Type  [place] save plan    PT Start Time  0848    PT Stop Time  0929    PT Time Calculation (min)  41 min    Activity Tolerance  Patient tolerated treatment well    Behavior During Therapy  [facility] for tasks assessed/performed       Past Medical History:  Diagnosis Date  . Family history of breast cancer 05/07/2019   Multiple relatives on maternal side, BRCA negative, pt's sister (-)BRCA   . History of anxiety 05/07/2019  . Somatic dysfunction of rib 05/07/2019    History reviewed. No pertinent surgical history.  There were no vitals filed for this visit.  Subjective Assessment - 09/30/19 0851    Subjective  Haven't been lifting my kids much over the past few days because I've been working. Pain is constant. Tolerable today.    Patient Stated Goals  return to prior physical activity level    Currently in Pain?  Yes    Pain Score  3     Pain Location  Rib cage         [facility] PT Assessment - 09/30/19 0001      Palpation   Palpation comment  left intercostal rib pain T8-10                    [facility] Adult PT Treatment/Exercise - 09/30/19 0001      Shoulder Exercises: ROM/Strengthening   UBE (Upper Arm Bike)  --      Manual Therapy   Manual Therapy  Soft tissue mobilization;Taping    Manual therapy comments  skilled palpation and monitoring of soft tissue during DN    Soft tissue mobilization  to muscles surrounding left shoulder girdle and biceps    Kinesiotex  Ligament Correction      Kinesiotix   Ligament Correction  Dynamic taping to left rib cage in SDLY; taped on exhale       Trigger Point Dry Needling - 09/30/19 0001    Consent Given?  Yes    Education Handout Provided  Previously provided    Muscles Treated Head and Neck  Upper trapezius    Muscles Treated Upper Quadrant  Pectoralis major;Pectoralis minor;Infraspinatus;Subscapularis;Deltoid;Latissimus dorsi;Teres major    Other Dry Needling  serratus ant    Upper Trapezius Response  Twitch reponse elicited;Palpable increased muscle length    Pectoralis Major Response  Twitch response elicited;Palpable increased muscle length    Pectoralis Minor Response  Twitch response elicited;Palpable increased muscle length    Infraspinatus Response  Twitch response elicited;Palpable increased muscle length    Subscapularis Response  Twitch response elicited;Palpable increased muscle length    Deltoid Response  Twitch response elicited;Palpable increased muscle length    Latissimus dorsi Response  Twitch response elicited;Palpable increased muscle length    Teres major Response  Twitch response elicited;Palpable increased muscle length           PT Education - 09/30/19 1844    Education  Details  DN education and aftercare; dynamic tape education; discussed slipping rib syndrome as possibility for pain    Person(s) Educated  Patient    Methods  Explanation    Comprehension  Verbalized understanding       PT Short Term Goals - 09/24/19 1253      PT SHORT TERM GOAL #1   Title  The patient will be indep with HEP for trunk stretching, L shoulder stability, upper back stabilization.    Time  4    Period  Weeks    Target Date  10/24/19      PT SHORT TERM GOAL #2   Title  The patient will tolerate UE weight bearing position (extended plank) x 30 seconds to demonstrate improved strength.    Time  4    Period  Weeks    Target Date  10/24/19      PT SHORT TERM GOAL #3   Title  The patient will verbalize  understanding of proper body mechanics for lifting toddlers and lifting overhead.    Time  4    Period  Weeks    Target Date  10/24/19        PT Long Term Goals - 09/24/19 1256      PT LONG TERM GOAL #1   Title  The patient will be independent with progression of HEP for post d/c.    Time  6    Period  Weeks    Target Date  11/08/19      PT LONG TERM GOAL #2   Title  The patient will tolerate AROM to 170 degrees for flexion and abduction with L shoulder without increase in pain from baseline.    Time  6    Period  Weeks    Target Date  11/08/19      PT LONG TERM GOAL #3   Title  The patient will tolerate jogging x 10 minutes per report without increase in pain from baseline.    Time  6    Period  Weeks    Target Date  11/08/19      PT LONG TERM GOAL #4   Title  The patient will improve FOTO score from 51% to > or equal to 65% to demonstrate dec'd disability from pain.    Time  6    Period  Weeks    Target Date  11/08/19            Plan - 09/30/19 1904    Clinical Impression Statement  Patient tolerated DN very well to left upper quadrant today. Discussed possibility of slipping rib syndrome although patient demos significant winging in left scapula as well. Dynamic tape was applied to left rib cage and pt plans to try running with the tape. DN may be beneficial to left intercostals as well.    PT Treatment/Interventions  ADLs/Self Care Home Management;Cryotherapy;Electrical Stimulation;Iontophoresis 27m/ml Dexamethasone;Moist Heat;Ultrasound;Therapeutic exercise;Therapeutic activities;Neuromuscular re-education;Patient/family education;Dry needling;Passive range of motion;Manual techniques;Joint Manipulations;Spinal Manipulations    PT Next Visit Plan  Check tolerance to HEP; assess dry needling in L parascapular region and taping; DN possibly to intercostals;  progression of strengthening.  Ultimate goal is to return to jogging and working out.    [place]      Patient will benefit from skilled therapeutic intervention in order to improve the following deficits and impairments:  Pain, Decreased range of motion, Increased fascial restricitons, Impaired flexibility  Visit Diagnosis: Muscle weakness (  generalized)  Abnormal posture  Chronic left shoulder pain     Problem List Patient Active Problem List   Diagnosis Date Noted  . History of anxiety 05/07/2019  . Somatic dysfunction of rib 05/07/2019  . Family history of breast cancer 05/07/2019    Madelyn Flavors PT 09/30/2019, 7:10 PM  [facility] [place], [place], [address] Phone: [phone]   Fax:  [phone]  Name: Olivia Sutton MRN: [id number] Date of Birth: Apr 05, 1986

## 2019-10-08 ENCOUNTER — Other Ambulatory Visit: Payer: Self-pay

## 2019-10-08 ENCOUNTER — Encounter: Payer: Self-pay | Admitting: Physical Therapy

## 2019-10-08 ENCOUNTER — Ambulatory Visit (INDEPENDENT_AMBULATORY_CARE_PROVIDER_SITE_OTHER): Payer: 59 | Admitting: Physical Therapy

## 2019-10-08 DIAGNOSIS — G8929 Other chronic pain: Secondary | ICD-10-CM | POA: Diagnosis not present

## 2019-10-08 DIAGNOSIS — M6281 Muscle weakness (generalized): Secondary | ICD-10-CM

## 2019-10-08 DIAGNOSIS — M25512 Pain in left shoulder: Secondary | ICD-10-CM

## 2019-10-08 DIAGNOSIS — R293 Abnormal posture: Secondary | ICD-10-CM

## 2019-10-08 NOTE — Therapy (Addendum)
[place], [place], [address] Phone: [phone]   Fax:  [phone]  Physical Therapy Treatment and D/C summary  Patient Details  Name: Olivia Sutton MRN: [id number] Date of Birth: [birth year] Referring Provider (PT): Gregor Hams, MD   Encounter Date: 10/08/2019  PT End of Session - 10/08/19 0850    Visit Number  3    Number of Visits  12    Date for PT Re-Evaluation  11/08/19    PT Start Time  0850    PT Stop Time  0936    PT Time Calculation (min)  46 min    Activity Tolerance  Patient tolerated treatment well    Behavior During Therapy  [facility] for tasks assessed/performed       Past Medical History:  Diagnosis Date  . Family history of breast cancer 05/07/2019   Multiple relatives on maternal side, BRCA negative, pt's sister (-)BRCA   . History of anxiety 05/07/2019  . Somatic dysfunction of rib 05/07/2019    History reviewed. No pertinent surgical history.  There were no vitals filed for this visit.  Subjective Assessment - 10/08/19 0855    Subjective  Reallly sore the next day but since then have felt good.    Patient Stated Goals  return to prior physical activity level    Currently in Pain?  No/denies                       [facility] Adult PT Treatment/Exercise - 10/08/19 0001      Shoulder Exercises: Supine   Protraction  Left;20 reps      Shoulder Exercises: Prone   Horizontal ABduction 1  Left;15 reps    Horizontal ABduction 2  15 reps;Left    Other Prone Exercises  Quadriped cat/camel x 10 reps, alternating UE reaching in quadriped x 10 reps.  Down dog x 10 sec hold    Other Prone Exercises  quad throacic rotation x 5 each, then left with bent arm x 5      Shoulder Exercises: Sidelying   External Rotation  Left;10 reps      Manual Therapy   Manual Therapy  Soft tissue mobilization    Manual therapy comments  skilled palpation and monitoring of soft tissue  during DN    Soft tissue mobilization  to left infraspinatus, teres major, UT and rhomboids    Kinesiotex  Ligament Correction      Kinesiotix   Ligament Correction  Dynamic taping to left rib cage in Standing; taped on exhale       Trigger Point Dry Needling - 10/08/19 0001    Consent Given?  Yes    Education Handout Provided  Previously provided    Muscles Treated Head and Neck  Upper trapezius    Muscles Treated Upper Quadrant  Pectoralis major;Pectoralis minor;Infraspinatus;Teres major;Rhomboids;Latissimus dorsi;Subscapularis    Dry Needling Comments  left    Upper Trapezius Response  Twitch reponse elicited;Palpable increased muscle length    Pectoralis Major Response  Twitch response elicited;Palpable increased muscle length    Pectoralis Minor Response  Twitch response elicited;Palpable increased muscle length    Rhomboids Response  Palpable increased muscle length    Infraspinatus Response  Twitch response elicited;Palpable increased muscle length    Subscapularis Response  Twitch response elicited;Palpable increased muscle length    Latissimus dorsi Response  Palpable increased muscle length    Teres major Response  Twitch response elicited  PT Education - 10/08/19 1948    Education Details  HEP Progressed    Person(s) Educated  Patient    Methods  Explanation;Demonstration;Handout    Comprehension  Verbalized understanding;Returned demonstration       PT Short Term Goals - 09/24/19 1253      PT SHORT TERM GOAL #1   Title  The patient will be indep with HEP for trunk stretching, L shoulder stability, upper back stabilization.    Time  4    Period  Weeks    Target Date  10/24/19      PT SHORT TERM GOAL #2   Title  The patient will tolerate UE weight bearing position (extended plank) x 30 seconds to demonstrate improved strength.    Time  4    Period  Weeks    Target Date  10/24/19      PT SHORT TERM GOAL #3   Title  The patient will verbalize  understanding of proper body mechanics for lifting toddlers and lifting overhead.    Time  4    Period  Weeks    Target Date  10/24/19        PT Long Term Goals - 09/24/19 1256      PT LONG TERM GOAL #1   Title  The patient will be independent with progression of HEP for post d/c.    Time  6    Period  Weeks    Target Date  11/08/19      PT LONG TERM GOAL #2   Title  The patient will tolerate AROM to 170 degrees for flexion and abduction with L shoulder without increase in pain from baseline.    Time  6    Period  Weeks    Target Date  11/08/19      PT LONG TERM GOAL #3   Title  The patient will tolerate jogging x 10 minutes per report without increase in pain from baseline.    Time  6    Period  Weeks    Target Date  11/08/19      PT LONG TERM GOAL #4   Title  The patient will improve FOTO score from 51% to > or equal to 65% to demonstrate dec'd disability from pain.    Time  6    Period  Weeks    Target Date  11/08/19            Plan - 10/08/19 1949    Clinical Impression Statement  Patient presents with decreased pain since last visit. She liked the dynamic tape but did not try running yet. She tolerated new exercises today without increased pain. Still tender along ribs but less than last visit. Good response to DN again today.    PT Frequency  2x / week    PT Duration  6 weeks    PT Treatment/Interventions  ADLs/Self Care Home Management;Cryotherapy;Electrical Stimulation;Iontophoresis 38m/ml Dexamethasone;Moist Heat;Ultrasound;Therapeutic exercise;Therapeutic activities;Neuromuscular re-education;Patient/family education;Dry needling;Passive range of motion;Manual techniques;Joint Manipulations;Spinal Manipulations    PT Next Visit Plan  Progress TE as tolerated; assess dry needling in L parascapular region and taping; DN possibly to intercostals;  Ultimate goal is to return to jogging and working out.    PT Home Exercise Plan  [email or web address]    Consulted and Agree  with Plan of Care  Patient       Patient will benefit from skilled therapeutic intervention in order to improve the following deficits and impairments:  Pain, Decreased range  of motion, Increased fascial restricitons, Impaired flexibility  Visit Diagnosis: Muscle weakness (generalized)  Abnormal posture  Chronic left shoulder pain     Problem List Patient Active Problem List   Diagnosis Date Noted  . History of anxiety 05/07/2019  . Somatic dysfunction of rib 05/07/2019  . Family history of breast cancer 05/07/2019    Madelyn Flavors PT 10/08/2019, 7:54 PM  [facility] [place], [place], [address] Phone: [phone]   Fax:  [phone]  Name: Addy Mcmannis MRN: [id number] Date of Birth: 1986-08-31  PHYSICAL THERAPY DISCHARGE SUMMARY  Visits from Start of Care: 3  Current functional level related to goals / functional outcomes: See above    Remaining deficits: See above    Education / Equipment: HEP  Plan: Patient agrees to discharge.  Patient goals were not met. Patient is being discharged due to not returning since the last visit.  ?????     Madelyn Flavors, PT 12/08/19 8:08 AM  [facility] [place], [place] [address]  [phone] (office) [phone] (fax)

## 2019-10-08 NOTE — Patient Instructions (Signed)
Access Code: [email or web address]  URL: [email or web address]  Date: 10/08/2019  Prepared by: Madelyn Flavors   Program Notes  Begin at 1 time per day and increase as long as you are tolerating these well.   Exercises Cat-Camel - 10 reps - 2 sets - 5 seconds hold - 2x daily - 7x weekly Quadruped Alternating Arm Lift - 5 reps - 2 sets - 5 seconds hold - 2x daily - 7x weekly Prone Single Arm Shoulder Horizontal Abduction with Dumbbell - Palm Down - 10 reps - 3 sets - 1x daily - 7x weekly Prone Single Arm Shoulder Y with Dumbbell - 10 reps - 3 sets - 1x daily - 7x weekly Prone Shoulder Extension - Single Arm with Dumbbell - 10 reps - 3 sets - 1x daily - 7x weekly Quadruped Thoracic Rotation with Hand on Low Back - 10 reps - 3 sets - 1x daily - 7x weekly Patient Education Trigger Point Dry Needling

## 2019-10-21 ENCOUNTER — Telehealth: Payer: 59

## 2019-10-21 ENCOUNTER — Encounter: Payer: 59 | Admitting: Physical Therapy

## 2019-12-17 MED FILL — SERTRALINE HCL 50 MG TABLET: 50 | 90 days supply | Qty: 90 | Fill #1

## 2020-03-25 MED FILL — SERTRALINE HCL 50 MG TABS: 50 | 90 days supply | Qty: 90 | Fill #2

## 2020-04-13 ENCOUNTER — Encounter: Payer: Self-pay | Admitting: Osteopathic Medicine

## 2020-04-13 ENCOUNTER — Ambulatory Visit (INDEPENDENT_AMBULATORY_CARE_PROVIDER_SITE_OTHER): Payer: 59 | Admitting: Osteopathic Medicine

## 2020-04-13 ENCOUNTER — Other Ambulatory Visit (HOSPITAL_COMMUNITY)
Admission: RE | Admit: 2020-04-13 | Discharge: 2020-04-13 | Disposition: A | Discharge status: Home / Self Care | Payer: 59 | Source: Ambulatory Visit | Attending: Osteopathic Medicine | Admitting: Osteopathic Medicine

## 2020-04-13 VITALS — BP 102/62 | HR 62 | Temp 98.3°F | Wt 154.0 lb

## 2020-04-13 DIAGNOSIS — Z8659 Personal history of other mental and behavioral disorders: Secondary | ICD-10-CM | POA: Diagnosis not present

## 2020-04-13 DIAGNOSIS — Z0000 Encounter for general adult medical examination without abnormal findings: Secondary | ICD-10-CM

## 2020-04-13 DIAGNOSIS — Z124 Encounter for screening for malignant neoplasm of cervix: Secondary | ICD-10-CM | POA: Diagnosis not present

## 2020-04-13 DIAGNOSIS — N841 Polyp of cervix uteri: Secondary | ICD-10-CM

## 2020-04-13 NOTE — Progress Notes (Signed)
Olivia Sutton is a 34 y.o. female who presents to  [place] at [facility]  today, 04/13/20, seeking care for the following:    Patient here for annual physical / wellness exam.  See preventive care reviewed as below.  Recent labs reviewed in detail with the patient.   Additional concerns today include:  None    ASSESSMENT & PLAN with other pertinent history/findings:  The primary encounter diagnosis was Annual physical exam. Diagnoses of History of anxiety, Cervical cancer screening, and Cervical polyp were also pertinent to this visit.    Patient Instructions  General Preventive Care  Most recent routine screening labs: ordered.   Everyone should have blood pressure checked once per year. Goal 130/80 or less.   Tobacco: don't!   Alcohol: responsible moderation is ok for most adults - if you have concerns about your alcohol intake, please talk to me!   Exercise: as tolerated to reduce risk of cardiovascular disease and diabetes. Strength training will also prevent osteoporosis.   Mental health: if need for mental health care (medicines, counseling, other), or concerns about moods, please let me know!   Sexual health: if need for STD testing, or if concerns with libido/pain problems, please let me know! If you need to discuss family planning options, please let me know!   Advanced Directive: Living Will and/or Healthcare Power of Attorney recommended for all adults, regardless of age or health.  Vaccines  Flu vaccine: for almost everyone, every fall.   Shingles vaccine: after age 60.   Pneumonia vaccines: after age 85  Tetanus booster: every 10 years  COVID vaccine: thanks for getting your vaccine!  Cancer screenings   Colon cancer screening: for everyone age 74-75.   Breast cancer screening: mammogram at age 33   Cervical cancer screening: Pap every 1 to 5 years depending on age and other risk factors. Can  usually stop at age 91 or w/ hysterectomy.   Lung cancer screening: not needed for non-smokers  Infection screenings  . HIV: recommended screening at least once age 51-65, more often as needed. . Gonorrhea/Chlamydia: screening as needed. . Hepatitis C: recommended once for everyone age 42-75 . TB: certain at-risk populations Other . Bone Density Test: recommended for women at age 40    Orders Placed This Encounter  Procedures  . CBC  . COMPLETE METABOLIC PANEL WITH GFR  . Lipid panel    No orders of the defined types were placed in this encounter.      Follow-up instructions: Return in about 1 year (around 04/13/2021) for [place] (call week prior to visit for lab orders).                                         BP 102/62 (BP Location: Left Arm, Patient Position: Sitting, Cuff Size: Normal)   Pulse 62   Temp 98.3 F (36.8 C) (Oral)   Wt 154 lb 0.6 oz (69.9 kg)   BMI 26.03 kg/m   Current Meds  Medication Sig  . acetaminophen (TYLENOL) 500 MG tablet Take 500 mg by mouth every 6 (six) hours as needed.  . sertraline (ZOLOFT) 50 MG tablet Take 1 tablet (50 mg total) by mouth daily.    No results found for this or any previous visit (from the past 72 hour(s)).  No results found.  Depression screen [facility] 2/9 04/13/2020 05/07/2019  Decreased Interest 0 0  Down, Depressed, Hopeless 0 0  PHQ - 2 Score 0 0  Altered sleeping 0 0  Tired, decreased energy 0 0  Change in appetite 0 0  Feeling bad or failure about yourself  0 0  Trouble concentrating 0 0  Moving slowly or fidgety/restless 0 0  Suicidal thoughts 0 0  PHQ-9 Score 0 0  Difficult doing work/chores Not difficult at all Not difficult at all    GAD 7 : Generalized Anxiety Score 04/13/2020 05/07/2019  Nervous, Anxious, on Edge 1 0  Control/stop worrying 1 0  Worry too much - different things 1 0  Trouble relaxing 1 0  Restless 0 0  Easily annoyed or irritable 0 0  Afraid -  awful might happen 1 0  Total GAD 7 Score 5 0  Anxiety Difficulty Not difficult at all Not difficult at all    Constitutional:  . VSS, see nurse notes . General Appearance: alert, well-developed, well-nourished, NAD Eyes: Marland Kitchen Normal lids and conjunctive, non-icteric sclera . PERRLA Ears, Nose, Mouth, Throat: . Normal appearance . Normal external auditory canal and TM bilaterally . MMM, posterior pharynx without erythema/exudate Neck: . No masses, trachea midline . No thyroid enlargement/tenderness/mass appreciated Respiratory: . Normal respiratory effort . No dullness/hyper-resonance to percussion . Breath sounds normal, no wheeze/rhonchi/rales Cardiovascular: . S1/S2 normal, no murmur/rub/gallop auscultated . No carotid bruit or JVD . Pedal pulse II/IV bilaterally DP and PT . No lower extremity edema Gastrointestinal: . Nontender, no masses . No hepatomegaly, no splenomegaly . No hernia appreciated Musculoskeletal:  . Gait normal . No clubbing/cyanosis of digits Neurological: . No cranial nerve deficit on limited exam . Motor and sensation intact and symmetric Psychiatric: . Normal judgment/insight . Normal mood and affect GYN: No lesions/ulcers to external genitalia, normal urethra, normal vaginal mucosa, physiologic discharge, cervix normal except fairly sizable polyp - grasp w/ ring forceps and attempted to remove but w/ twisting and gentle traction was unable to make any progress, uterus not enlarged or tender, adnexa no masses and nontender   All questions at time of visit were answered - patient instructed to contact office with any additional concerns or updates.  ER/RTC precautions were reviewed with the patient.  Please note: voice recognition software was used to produce this document, and typos may escape review. Please contact Dr. Sheppard Coil for any needed clarifications.

## 2020-04-13 NOTE — Patient Instructions (Addendum)
General Preventive Care  Most recent routine screening labs: ordered.   Everyone should have blood pressure checked once per year. Goal 130/80 or less.   Tobacco: don't!   Alcohol: responsible moderation is ok for most adults - if you have concerns about your alcohol intake, please talk to me!   Exercise: as tolerated to reduce risk of cardiovascular disease and diabetes. Strength training will also prevent osteoporosis.   Mental health: if need for mental health care (medicines, counseling, other), or concerns about moods, please let me know!   Sexual health: if need for STD testing, or if concerns with libido/pain problems, please let me know! If you need to discuss family planning options, please let me know!   Advanced Directive: Living Will and/or Healthcare Power of Attorney recommended for all adults, regardless of age or health.  Vaccines  Flu vaccine: for almost everyone, every fall.   Shingles vaccine: after age 32.   Pneumonia vaccines: after age 31  Tetanus booster: every 10 years  COVID vaccine: thanks for getting your vaccine!  Cancer screenings   Colon cancer screening: for everyone age 40-75.   Breast cancer screening: mammogram at age 32   Cervical cancer screening: Pap every 1 to 5 years depending on age and other risk factors. Can usually stop at age 68 or w/ hysterectomy.   Lung cancer screening: not needed for non-smokers  Infection screenings  . HIV: recommended screening at least once age 48-65, more often as needed. . Gonorrhea/Chlamydia: screening as needed. . Hepatitis C: recommended once for everyone age 77-75 . TB: certain at-risk populations Other . Bone Density Test: recommended for women at age 52

## 2020-04-15 DIAGNOSIS — Z8659 Personal history of other mental and behavioral disorders: Secondary | ICD-10-CM | POA: Diagnosis not present

## 2020-04-15 DIAGNOSIS — Z0000 Encounter for general adult medical examination without abnormal findings: Secondary | ICD-10-CM | POA: Diagnosis not present

## 2020-04-15 LAB — CYTOLOGY - PAP
Comment: NEGATIVE
Diagnosis: NEGATIVE
High risk HPV: NEGATIVE

## 2020-04-15 LAB — CBC
HCT: 38.9 % (ref 35.0–45.0)
Hemoglobin: 13.1 g/dL (ref 11.7–15.5)
MCH: 33.3 pg — ABNORMAL HIGH (ref 27.0–33.0)
MCHC: 33.7 g/dL (ref 32.0–36.0)
MCV: 99 fL (ref 80.0–100.0)
MPV: 10.6 fL (ref 7.5–12.5)
Platelets: 241 10*3/uL (ref 140–400)
RBC: 3.93 10*6/uL (ref 3.80–5.10)
RDW: 11.6 % (ref 11.0–15.0)
WBC: 5.8 10*3/uL (ref 3.8–10.8)

## 2020-04-15 LAB — COMPLETE METABOLIC PANEL WITH GFR
AG Ratio: 1.8 (calc) (ref 1.0–2.5)
ALT: 14 U/L (ref 6–29)
AST: 14 U/L (ref 10–30)
Albumin: 4.2 g/dL (ref 3.6–5.1)
Alkaline phosphatase (APISO): 38 U/L (ref 31–125)
BUN: 14 mg/dL (ref 7–25)
CO2: 29 mmol/L (ref 20–32)
Calcium: 9.3 mg/dL (ref 8.6–10.2)
Chloride: 104 mmol/L (ref 98–110)
Creat: 0.75 mg/dL (ref 0.50–1.10)
GFR, Est African American: 119 mL/min/{1.73_m2} (ref >=60)
GFR, Est Non African American: 103 mL/min/{1.73_m2} (ref >=60)
Globulin: 2.4 g/dL (calc) (ref 1.9–3.7)
Glucose, Bld: 84 mg/dL (ref 65–99)
Potassium: 4.7 mmol/L (ref 3.5–5.3)
Sodium: 138 mmol/L (ref 135–146)
Total Bilirubin: 0.5 mg/dL (ref 0.2–1.2)
Total Protein: 6.6 g/dL (ref 6.1–8.1)

## 2020-04-15 LAB — LIPID PANEL
Cholesterol: 126 mg/dL (ref <200)
HDL: 45 mg/dL — ABNORMAL LOW (ref >=50)
LDL Cholesterol (Calc): 73 mg/dL (calc)
Non-HDL Cholesterol (Calc): 81 mg/dL (calc) (ref <130)
Total CHOL/HDL Ratio: 2.8 (calc) (ref <5.0)
Triglycerides: 27 mg/dL (ref <150)

## 2020-05-17 ENCOUNTER — Ambulatory Visit (INDEPENDENT_AMBULATORY_CARE_PROVIDER_SITE_OTHER): Payer: 59 | Admitting: Obstetrics & Gynecology

## 2020-05-17 ENCOUNTER — Other Ambulatory Visit: Payer: Self-pay

## 2020-05-17 ENCOUNTER — Encounter: Payer: Self-pay | Admitting: Obstetrics & Gynecology

## 2020-05-17 VITALS — BP 99/59 | HR 52 | Resp 16 | Ht 64.5 in | Wt 152.0 lb

## 2020-05-17 DIAGNOSIS — N86 Erosion and ectropion of cervix uteri: Secondary | ICD-10-CM

## 2020-05-17 NOTE — Progress Notes (Signed)
   Subjective:    Patient ID: Olivia Sutton, female    DOB: 09-21-1986, 34 y.o.   MRN: [id number]  HPI  Pt presents for cervical polyp form PCP.  Attempt was made at PCP office to remove and documented as unsuccessful.  Pt bled for 10 days post attempt.  NO bleeding recently.  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  Review of Systems  Constitutional: Negative.   Respiratory: Negative.   Cardiovascular: Negative.   Gastrointestinal: Negative.   Genitourinary: Negative.        Objective:   Physical Exam Vitals reviewed.  Constitutional:      General: She is not in acute distress.    Appearance: She is well-developed.  HENT:     Head: Normocephalic and atraumatic.  Eyes:     Conjunctiva/sclera: Conjunctivae normal.  Cardiovascular:     Rate and Rhythm: Normal rate.  Pulmonary:     Effort: Pulmonary effort is normal.  Genitourinary:    Comments: ectropion visible.  No polyp. Bimanual normal Skin:    General: Skin is warm and dry.  Neurological:     Mental Status: She is alert and oriented to person, place, and time.            Assessment & Plan:  34 yo female with ectropion.  Cervical polyp no longer present.  Most likely detached between PCP visit and Gyn visit.  Pt re assured.  All questions answered.

## 2020-06-25 MED FILL — SERTRALINE HCL 50 MG TABS: 50 | 90 days supply | Qty: 90 | Fill #3

## 2020-07-15 DIAGNOSIS — Z03818 Encounter for observation for suspected exposure to other biological agents ruled out: Secondary | ICD-10-CM | POA: Diagnosis not present

## 2020-07-15 DIAGNOSIS — Z20822 Contact with and (suspected) exposure to covid-19: Secondary | ICD-10-CM | POA: Diagnosis not present

## 2020-09-30 ENCOUNTER — Ambulatory Visit (INDEPENDENT_AMBULATORY_CARE_PROVIDER_SITE_OTHER): Payer: 59

## 2020-09-30 ENCOUNTER — Ambulatory Visit (INDEPENDENT_AMBULATORY_CARE_PROVIDER_SITE_OTHER): Payer: 59 | Admitting: Nurse Practitioner

## 2020-09-30 ENCOUNTER — Encounter: Payer: Self-pay | Admitting: Nurse Practitioner

## 2020-09-30 ENCOUNTER — Other Ambulatory Visit: Payer: Self-pay

## 2020-09-30 ENCOUNTER — Other Ambulatory Visit: Payer: Self-pay | Admitting: Nurse Practitioner

## 2020-09-30 VITALS — BP 99/62 | HR 51 | Temp 98.0°F | Ht 64.5 in | Wt 159.4 lb

## 2020-09-30 DIAGNOSIS — M7121 Synovial cyst of popliteal space [Baker], right knee: Secondary | ICD-10-CM

## 2020-09-30 DIAGNOSIS — M67969 Unspecified disorder of synovium and tendon, unspecified lower leg: Secondary | ICD-10-CM | POA: Insufficient documentation

## 2020-09-30 DIAGNOSIS — M25461 Effusion, right knee: Secondary | ICD-10-CM | POA: Diagnosis not present

## 2020-09-30 DIAGNOSIS — M6790 Unspecified disorder of synovium and tendon, unspecified site: Secondary | ICD-10-CM | POA: Diagnosis not present

## 2020-09-30 MED ORDER — MELOXICAM 15 MG PO TABS: 15.0000 mg | ORAL_TABLET | Freq: Every day | ORAL | 3 refills | Status: DC

## 2020-09-30 MED FILL — MELOXICAM 15 MG TABLET: 15 | 30 days supply | Qty: 30 | Fill #0

## 2020-09-30 NOTE — Assessment & Plan Note (Signed)
Symptoms and presentation consistent with semimembranous tendinopathy of the right lower extremity.  See recommendations for Bakers Cyst above.

## 2020-09-30 NOTE — Patient Instructions (Signed)
Baker Cyst  A Baker cyst, also called a popliteal cyst, is a growth that forms at the back of the knee. The cyst forms when the fluid-filled sac (bursa) that cushions the knee joint becomes enlarged. What are the causes? In most cases, a Baker cyst results from another knee problem that causes swelling inside the knee. This makes the fluid inside the knee joint (synovial fluid) flow into the bursa behind the knee, causing the bursa to enlarge. What increases the risk? You may be more likely to develop a Baker cyst if you already have a knee problem, such as:  A tear in cartilage that cushions the knee joint (meniscal tear).  A tear in the tissues that connect the bones of the knee joint (ligament tear).  Knee swelling from osteoarthritis, rheumatoid arthritis, or gout. What are the signs or symptoms? The main symptom of this condition is a lump behind the knee. This may be the only symptom of the condition. The lump may be painful, especially when the knee is straightened. If the lump is painful, the pain may come and go. The knee may also be stiff. Symptoms may quickly get more severe if the cyst breaks open (ruptures). If the cyst ruptures, you may feel the following in your knee and calf:  Sudden or worsening pain.  Swelling.  Bruising.  Redness in the calf. A Baker cyst does not always cause symptoms. How is this diagnosed? This condition may be diagnosed based on your symptoms and medical history. Your health care provider will also do a physical exam. This may include:  Feeling the cyst to check whether it is tender.  Checking your knee for signs of another knee condition that causes swelling. You may have imaging tests, such as:  X-rays.  MRI.  Ultrasound. How is this treated? A Baker cyst that is not painful may go away without treatment. If the cyst gets large or painful, it will likely get better if the underlying knee problem is treated. If needed, treatment for a  Baker cyst may include:  Resting.  Keeping weight off of the knee. This means not leaning on the knee to support your body weight.  Taking NSAIDs, such as ibuprofen, to reduce pain and swelling.  Having a procedure to drain the fluid from the cyst with a needle (aspiration). You may also get an injection of a medicine that reduces swelling (steroid).  Having surgery. This may be needed if other treatments do not work. This usually involves correcting knee damage and removing the cyst. Follow these instructions at home:  Activity  Rest as told by your health care provider.  Avoid activities that make pain or swelling worse.  Return to your normal activities as told by your health care provider. Ask your health care provider what activities are safe for you.  Do not use the injured limb to support your body weight until your health care provider says that you can. Use crutches as told by your health care provider. General instructions  Take over-the-counter and prescription medicines only as told by your health care provider.  Keep all follow-up visits as told by your health care provider. This is important. Contact a health care provider if:  You have knee pain, stiffness, or swelling that does not get better. Get help right away if:  You have sudden or worsening pain and swelling in your calf area. Summary  A Baker cyst, also called a popliteal cyst, is a growth that forms at the  back of the knee.  In most cases, a Baker cyst results from another knee problem that causes swelling inside the knee.  A Baker cyst that is not painful may go away without treatment.  If needed, treatment for a Baker cyst may include resting, keeping weight off of the knee, medicines, or draining fluid from the cyst.  Surgery may be needed if other treatments are not effective. This information is not intended to replace advice given to you by your health care provider. Make sure you discuss any  questions you have with your health care provider. Document Revised: 03/28/2019 Document Reviewed: 03/28/2019 Elsevier Patient Education  Olivia Sutton.

## 2020-09-30 NOTE — Assessment & Plan Note (Signed)
Symptoms and presentation consistent with bakers cyst to the right popliteal fossa on the medial side. No signs of rupture or joint instability at the time of evaluation.  Recommend gentle stretching exercises, alternating ice and heat to area, gentle compression, and meloxicam as needed once a day.  Will obtain x-ray today to rule out other abnormality.  Recommend follow-up with Dr. Darene Lamer in 4 weeks if symptoms are not improved, for US guided drainage of cyst.

## 2020-09-30 NOTE — Progress Notes (Signed)
Acute Office Visit  Subjective:    Patient ID: Olivia Sutton, female    DOB: 1986/06/03, 34 y.o.   MRN: [id number]  Chief Complaint  Patient presents with  . Knee Pain    radiating up thigh    HPI Olivia Sutton is a 34 year old female presenting today for posterior right knee pain that started this past Saturday.  She reports the pain is located behind the right knee on the medial side it is constant with a 4-5/10 at worst.  She does report that physical activity seems to help eliminate the pain and immobilization or limited use does make it worse.  She tells me that standing from a seated position is quite painful.  She has no known acute injury to the knee however does report previous injury to this knee many years ago.  She has never had surgery.  She reports the onset was sudden and she is experiencing some radiation up into the posterior medial thigh.  She is able to bear weight but it can be painful.  She does tell me that she took 2 Tylenol this morning which seems to have helped with some of her symptoms.  She denies redness, edema, asymmetry to her lower extremities, fevers, chills, decreased sensation, or red streaks. She denies weakness or feeling that her knee will give away with movement or walking.  She does endorse popliteal pain along the median boarder at the area of the common tendon of the serratus muscle.   Past Medical History:  Diagnosis Date  . Family history of breast cancer 05/07/2019   Multiple relatives on maternal side, BRCA negative, pt's sister (-)BRCA   . History of anxiety 05/07/2019  . Somatic dysfunction of rib 05/07/2019    History reviewed. No pertinent surgical history.  Family History  Problem Relation Age of Onset  . High blood pressure Father   . Diabetes Father   . Breast cancer Maternal Grandmother   . Heart attack Maternal Grandfather   . Stroke Paternal Grandfather     Social History   Socioeconomic History  . Marital status: Electrical engineer    Spouse name: Not on file  . Number of children: 2  . Years of education: Not on file  . Highest education level: Not on file  Occupational History  . Occupation: Nurse  Tobacco Use  . Smoking status: Never Smoker  . Smokeless tobacco: Never Used  Vaping Use  . Vaping Use: Never used  Substance and Sexual Activity  . Alcohol use: Yes    Alcohol/week: 1.0 standard drink    Types: 1 Standard drinks or equivalent per week  . Drug use: Never  . Sexual activity: Yes    Partners: Female    Birth control/protection: None  Other Topics Concern  . Not on file  Social History Narrative  . Not on file   Social Determinants of Health   Financial Resource Strain:   . Difficulty of Paying Living Expenses: Not on file  Food Insecurity:   . Worried About Charity fundraiser in the Last Year: Not on file  . Ran Out of Food in the Last Year: Not on file  Transportation Needs:   . Lack of Transportation (Medical): Not on file  . Lack of Transportation (Non-Medical): Not on file  Physical Activity:   . Days of Exercise per Week: Not on file  . Minutes of Exercise per Session: Not on file  Stress:   . Feeling of Stress : Not  on file  Social Connections:   . Frequency of Communication with Friends and Family: Not on file  . Frequency of Social Gatherings with Friends and Family: Not on file  . Attends Religious Services: Not on file  . Active Member of Clubs or Organizations: Not on file  . Attends Archivist Meetings: Not on file  . Marital Status: Not on file  Intimate Partner Violence:   . Fear of Current or Ex-Partner: Not on file  . Emotionally Abused: Not on file  . Physically Abused: Not on file  . Sexually Abused: Not on file    Outpatient Medications Prior to Visit  Medication Sig Dispense Refill  . acetaminophen (TYLENOL) 500 MG tablet Take 500 mg by mouth every 6 (six) hours as needed.    . sertraline (ZOLOFT) 50 MG tablet Take 1 tablet (50 mg total)  by mouth daily. 90 tablet 3   No facility-administered medications prior to visit.    Allergies  Allergen Reactions  . Sulfamethoxazole-Trimethoprim Rash    LFT elevation, fever Drug-induced hepatitis Elevated liver enzymes, rash, fever     Review of Systems All review of systems negative except what is listed in the HPI     Objective:    Physical Exam Vitals and nursing note reviewed.  Constitutional:      Appearance: Normal appearance. She is normal weight.  HENT:     Head: Normocephalic.  Eyes:     Conjunctiva/sclera: Conjunctivae normal.     Pupils: Pupils are equal, round, and reactive to light.  Cardiovascular:     Rate and Rhythm: Normal rate.     Pulses: Normal pulses.  Pulmonary:     Effort: Pulmonary effort is normal.  Musculoskeletal:        General: Tenderness present. No swelling, deformity or signs of injury.     Cervical back: Normal range of motion.     Right knee: No swelling, erythema, ecchymosis, bony tenderness or crepitus. Normal range of motion. Tenderness present over the medial joint line. No LCL laxity, MCL laxity, ACL laxity or PCL laxity. Normal alignment and normal patellar mobility. Normal pulse.     Right lower leg: No edema.     Left lower leg: No edema.       Legs:  Skin:    General: Skin is warm and dry.     Capillary Refill: Capillary refill takes less than 2 seconds.  Neurological:     General: No focal deficit present.     Mental Status: She is alert and oriented to person, place, and time.     Motor: No weakness.     Coordination: Coordination normal.     Gait: Gait normal.  Psychiatric:        Mood and Affect: Mood normal.        Behavior: Behavior normal.        Thought Content: Thought content normal.        Judgment: Judgment normal.     BP 99/62   Pulse (!) 51   Temp 98 F (36.7 C) (Oral)   Ht 5' 4.5" (1.638 m)   Wt 159 lb 6.4 oz (72.3 kg)   SpO2 98%   BMI 26.94 kg/m  Wt Readings from Last 3 Encounters:    09/30/20 159 lb 6.4 oz (72.3 kg)  05/17/20 152 lb (68.9 kg)  04/13/20 154 lb 0.6 oz (69.9 kg)    There are no preventive care reminders to display for this patient.  There are no preventive care reminders to display for this patient.   Lab Results  Component Value Date   TSH 0.68 05/07/2019   Lab Results  Component Value Date   WBC 5.8 04/15/2020   HGB 13.1 04/15/2020   HCT 38.9 04/15/2020   MCV 99.0 04/15/2020   PLT 241 04/15/2020   Lab Results  Component Value Date   NA 138 04/15/2020   K 4.7 04/15/2020   CO2 29 04/15/2020   GLUCOSE 84 04/15/2020   BUN 14 04/15/2020   CREATININE 0.75 04/15/2020   BILITOT 0.5 04/15/2020   AST 14 04/15/2020   ALT 14 04/15/2020   PROT 6.6 04/15/2020   CALCIUM 9.3 04/15/2020   Lab Results  Component Value Date   CHOL 126 04/15/2020   Lab Results  Component Value Date   HDL 45 (L) 04/15/2020   Lab Results  Component Value Date   LDLCALC 73 04/15/2020   Lab Results  Component Value Date   TRIG 27 04/15/2020   Lab Results  Component Value Date   CHOLHDL 2.8 04/15/2020   No results found for: HGBA1C     Assessment & Plan:   Problem List Items Addressed This Visit      Musculoskeletal and Integument   Baker's cyst of knee, right - Primary    Symptoms and presentation consistent with bakers cyst to the right popliteal fossa on the medial side. No signs of rupture or joint instability at the time of evaluation.  Recommend gentle stretching exercises, alternating ice and heat to area, gentle compression, and meloxicam as needed once a day.  Will obtain x-ray today to rule out other abnormality.  Recommend follow-up with Dr. Darene Lamer in 4 weeks if symptoms are not improved, for US guided drainage of cyst.       Relevant Medications   meloxicam (MOBIC) 15 MG tablet   Other Relevant Orders   DG Knee Complete 4 Views Right   Lower extremity tendinopathy    Symptoms and presentation consistent with semimembranous tendinopathy  of the right lower extremity.  See recommendations for Bakers Cyst above.        Relevant Medications   meloxicam (MOBIC) 15 MG tablet   Other Relevant Orders   DG Knee Complete 4 Views Right       Meds ordered this encounter  Medications  . meloxicam (MOBIC) 15 MG tablet    Sig: Take 1 tablet (15 mg total) by mouth daily.    Dispense:  30 tablet    Refill:  3     Orma Render, NP

## 2020-10-01 NOTE — Progress Notes (Signed)
Olivia Sutton,   Your x-rays showed what we expected. There is a bakers cyst present, which is causing the pain. Continue as planned and follow-up with Dr. Darene Lamer if the pain continues after the current treatment. Let me know if you have any questions! [name]

## 2020-10-20 ENCOUNTER — Institutional Professional Consult (permissible substitution): Payer: 59 | Admitting: Sports Medicine

## 2021-03-01 ENCOUNTER — Other Ambulatory Visit: Payer: Self-pay

## 2021-03-01 ENCOUNTER — Other Ambulatory Visit (HOSPITAL_COMMUNITY): Payer: Self-pay

## 2021-03-01 MED ORDER — SERTRALINE HCL 50 MG PO TABS
50.0000 mg | ORAL_TABLET | Freq: Every day | ORAL | 0 refills | Status: DC
  Filled 2021-03-01: qty 90, 90d supply, fill #0

## 2021-03-07 ENCOUNTER — Encounter: Payer: Self-pay | Admitting: Osteopathic Medicine

## 2021-03-08 MED ORDER — SERTRALINE HCL 50 MG PO TABS: 50.0000 mg | ORAL_TABLET | Freq: Every day | ORAL | 0 refills | Status: DC

## 2021-03-11 ENCOUNTER — Other Ambulatory Visit (HOSPITAL_COMMUNITY): Payer: Self-pay

## 2021-03-11 ENCOUNTER — Other Ambulatory Visit: Payer: Self-pay | Admitting: Osteopathic Medicine

## 2021-03-14 ENCOUNTER — Other Ambulatory Visit (HOSPITAL_COMMUNITY): Payer: Self-pay

## 2021-03-14 MED ORDER — SERTRALINE HCL 50 MG PO TABS
50.0000 mg | ORAL_TABLET | Freq: Every day | ORAL | 0 refills | Status: DC
  Filled 2021-03-14: qty 90, 90d supply, fill #0

## 2021-03-14 MED ORDER — SERTRALINE HCL 50 MG PO TABS
50.0000 mg | ORAL_TABLET | Freq: Every day | ORAL | 0 refills | Status: DC
  Filled 2021-03-14: qty 30, 30d supply, fill #0

## 2021-03-24 ENCOUNTER — Other Ambulatory Visit (HOSPITAL_COMMUNITY): Payer: Self-pay

## 2021-03-25 ENCOUNTER — Other Ambulatory Visit (HOSPITAL_COMMUNITY): Payer: Self-pay

## 2021-04-05 ENCOUNTER — Other Ambulatory Visit (HOSPITAL_COMMUNITY): Payer: Self-pay

## 2021-04-26 ENCOUNTER — Other Ambulatory Visit: Payer: Self-pay | Admitting: Osteopathic Medicine

## 2021-04-26 ENCOUNTER — Other Ambulatory Visit (HOSPITAL_COMMUNITY): Payer: Self-pay

## 2021-04-26 MED ORDER — SERTRALINE HCL 50 MG PO TABS
50.0000 mg | ORAL_TABLET | Freq: Every day | ORAL | 0 refills | Status: DC
  Filled 2021-04-26: qty 30, 30d supply, fill #0

## 2021-04-27 ENCOUNTER — Encounter: Payer: 59 | Admitting: Osteopathic Medicine

## 2021-05-03 ENCOUNTER — Other Ambulatory Visit (HOSPITAL_COMMUNITY): Payer: Self-pay

## 2021-05-17 ENCOUNTER — Other Ambulatory Visit (HOSPITAL_COMMUNITY): Payer: Self-pay

## 2021-05-17 ENCOUNTER — Other Ambulatory Visit: Payer: Self-pay

## 2021-05-17 ENCOUNTER — Encounter: Payer: Self-pay | Admitting: Osteopathic Medicine

## 2021-05-17 ENCOUNTER — Ambulatory Visit (INDEPENDENT_AMBULATORY_CARE_PROVIDER_SITE_OTHER): Payer: 59 | Admitting: Osteopathic Medicine

## 2021-05-17 VITALS — BP 101/64 | HR 57 | Temp 97.9°F | Ht 64.5 in | Wt 163.0 lb

## 2021-05-17 DIAGNOSIS — Z803 Family history of malignant neoplasm of breast: Secondary | ICD-10-CM | POA: Diagnosis not present

## 2021-05-17 DIAGNOSIS — Z0000 Encounter for general adult medical examination without abnormal findings: Secondary | ICD-10-CM

## 2021-05-17 DIAGNOSIS — Z8659 Personal history of other mental and behavioral disorders: Secondary | ICD-10-CM | POA: Diagnosis not present

## 2021-05-17 MED ORDER — SERTRALINE HCL 50 MG PO TABS
50.0000 mg | ORAL_TABLET | Freq: Every day | ORAL | 3 refills | Status: DC
  Filled 2021-05-17 – 2021-06-15 (×2): qty 90, 90d supply, fill #0
  Filled 2021-12-12: qty 90, 90d supply, fill #1
  Filled 2022-04-24: qty 90, 90d supply, fill #2

## 2021-05-17 NOTE — Progress Notes (Signed)
Olivia Sutton is a 35 y.o. female who presents to  [place] [facility]  today, 05/17/21, seeking care for the following:  Annual physical      ASSESSMENT & PLAN with other pertinent findings:  The primary encounter diagnosis was Annual physical exam. Diagnoses of Family history of breast cancer and History of anxiety were also pertinent to this visit.    There are no Patient Instructions on file for this visit.  Orders Placed This Encounter  Procedures   MM 3D SCREEN BREAST BILATERAL   CBC   COMPLETE METABOLIC PANEL WITH GFR   Lipid panel   TSH    Meds ordered this encounter  Medications   sertraline (ZOLOFT) 50 MG tablet    Sig: Take 1 tablet (50 mg total) by mouth daily.    Dispense:  90 tablet    Refill:  3    Ok to mail, please send 90 days supply thanks!   General Preventive Care Most recent routine screening labs: ordered.  Blood pressure goal 130/80 or less.  Tobacco: don't!  Alcohol: responsible moderation is ok for most adults - if you have concerns about your alcohol intake, please talk to me!  Exercise: as tolerated to reduce risk of cardiovascular disease and diabetes. Strength training will also prevent osteoporosis.  Mental health: if need for mental health care (adjust medicines, counseling, other), or concerns about moods, please let me know!  Sexual / Reproductive health: if need for STD testing, or if concerns with libido/pain problems, please let me know! If you need to discuss family planning, please let me know!  Advanced Directive: Living Will and/or Healthcare Power of Attorney recommended for all adults, regardless of age or health.  Vaccines Flu vaccine: for almost everyone, every fall.  Shingles vaccine: after age 18.  Pneumonia vaccines: after age 6. Tetanus booster: every 10 years, due 2030 / 3rd trimester of pregnancy COVID vaccine: THANKS for getting your vaccine and booster! :)   Cancer screenings  Colon cancer screening: for everyone age 19. Colonoscopy available for all, many people also qualify for the Cologuard stool test Breast cancer screening: mammogram at age 59 for sure. Can arrange genetic testing for breast cancer and other hereditary cancer risk. Any positive results, will refer to genetic counselor.  Cervical cancer screening: Pap due 03/2025 Lung cancer screening: not needed for non-smokers  Infection screenings  HIV: recommended screening at least once age 3-65, more often as needed. Gonorrhea/Chlamydia, other STI: screening as needed Hepatitis C: recommended once for everyone age 14-97 TB: certain at-risk populations, or depending on work requirements and/or travel history Other Bone Density Test: recommended at age 100       See below for relevant physical exam findings  See below for recent lab and imaging results reviewed  Medications, allergies, PMH, PSH, SocH, [place] reviewed below    Follow-up instructions: Return in about 1 year (around 05/17/2022) for [place] (call week prior to visit for lab orders).                                        Exam:  BP 101/64 (BP Location: Left Arm, Patient Position: Sitting, Cuff Size: Normal)   Pulse (!) 57   Temp 97.9 F (36.6 C) (Oral)   Ht 5' 4.5" (1.638 m)   Wt 163 lb (73.9 kg)   BMI 27.55 kg/m  Constitutional: VS see above. General Appearance: alert, well-developed, well-nourished, NAD Neck: No masses, trachea midline.  Respiratory: Normal respiratory effort. no wheeze, no rhonchi, no rales Cardiovascular: S1/S2 normal, no murmur, no rub/gallop auscultated. RRR.  Musculoskeletal: Gait normal. Symmetric and independent movement of all extremities Abdominal: non-tender, non-distended, no appreciable organomegaly, neg Murphy's, BS WNLx4 Neurological: Normal balance/coordination. No tremor. Skin: warm, dry, intact.  Psychiatric: Normal judgment/insight.  Normal mood and affect. Oriented x3.   Current Meds  Medication Sig   acetaminophen (TYLENOL) 500 MG tablet Take 500 mg by mouth every 6 (six) hours as needed.   [DISCONTINUED] sertraline (ZOLOFT) 50 MG tablet Take 1 tablet (50 mg total) by mouth daily.    Allergies  Allergen Reactions   Sulfamethoxazole-Trimethoprim Rash    LFT elevation, fever Drug-induced hepatitis Elevated liver enzymes, rash, fever     Patient Active Problem List   Diagnosis Date Noted   Baker's cyst of knee, right 09/30/2020   Lower extremity tendinopathy 09/30/2020   History of anxiety 05/07/2019   Somatic dysfunction of rib 05/07/2019   Family history of breast cancer 05/07/2019    Family History  Problem Relation Age of Onset   High blood pressure Father    Diabetes Father    Breast cancer Maternal Grandmother    Heart attack Maternal Grandfather    Stroke Paternal Grandfather     Social History   Tobacco Use  Smoking Status Never  Smokeless Tobacco Never    No past surgical history on file.  Immunization History  Administered Date(s) Administered   Influenza,inj,Quad PF,6+ Mos 09/08/2018, 01/21/2019, 08/20/2019   Influenza-Unspecified 10/08/2009, 09/26/2012, 08/31/2015, 08/20/2020   PFIZER(Purple Top)SARS-COV-2 Vaccination 11/10/2019, 11/24/2019, 09/03/2020   PPD Test 03/24/2014, 03/24/2015, 03/21/2016   Tdap 06/20/2010, 02/26/2018, 01/16/2019    Recent Results (from the past 2160 hour(s))  CBC     Status: None   Collection Time: 05/17/21 12:00 AM  Result Value Ref Range   WBC 4.7 3.8 - 10.8 Thousand/uL   RBC 4.18 3.80 - 5.10 Million/uL   Hemoglobin 13.6 11.7 - 15.5 g/dL   HCT 41.6 35.0 - 45.0 %   MCV 99.5 80.0 - 100.0 fL   MCH 32.5 27.0 - 33.0 pg   MCHC 32.7 32.0 - 36.0 g/dL   RDW 11.5 11.0 - 15.0 %   Platelets 268 140 - 400 Thousand/uL   MPV 11.3 7.5 - 12.5 fL  COMPLETE METABOLIC PANEL WITH GFR     Status: None   Collection Time: 05/17/21 12:00 AM  Result Value Ref  Range   Glucose, Bld 80 65 - 99 mg/dL    Comment: .            Fasting reference interval .    BUN 13 7 - 25 mg/dL   Creat 0.65 0.50 - 1.10 mg/dL   GFR, Est Non African American 113 > OR = 60 mL/min/1.73m2   GFR, Est African American 131 > OR = 60 mL/min/1.34m2   BUN/Creatinine Ratio NOT APPLICABLE 6 - 22 (calc)   Sodium 139 135 - 146 mmol/L   Potassium 4.7 3.5 - 5.3 mmol/L   Chloride 102 98 - 110 mmol/L   CO2 28 20 - 32 mmol/L   Calcium 9.7 8.6 - 10.2 mg/dL   Total Protein 7.1 6.1 - 8.1 g/dL   Albumin 4.6 3.6 - 5.1 g/dL   Globulin 2.5 1.9 - 3.7 g/dL (calc)   AG Ratio 1.8 1.0 - 2.5 (calc)   Total Bilirubin 0.5 0.2 -  1.2 mg/dL   Alkaline phosphatase (APISO) 42 31 - 125 U/L   AST 13 10 - 30 U/L   ALT 11 6 - 29 U/L  Lipid panel     Status: None   Collection Time: 05/17/21 12:00 AM  Result Value Ref Range   Cholesterol 159 <200 mg/dL   HDL 55 > OR = 50 mg/dL   Triglycerides 36 <150 mg/dL   LDL Cholesterol (Calc) 93 mg/dL (calc)    Comment: Reference range: <100 . Desirable range <100 mg/dL for primary prevention;   <70 mg/dL for patients with CHD or diabetic patients  with > or = 2 CHD risk factors. [place] Kitchen LDL-C is now calculated using the Martin-Hopkins  calculation, which is a validated novel method providing  better accuracy than the Friedewald equation in the  estimation of LDL-C.  Cresenciano Genre et al. Annamaria Helling. 0100;712(19): 2061-2068  (http://education.[employer].com/faq/FAQ164)    Total CHOL/HDL Ratio 2.9 <5.0 (calc)   Non-HDL Cholesterol (Calc) 104 <130 mg/dL (calc)    Comment: For patients with diabetes plus 1 major ASCVD risk  factor, treating to a non-HDL-C goal of <100 mg/dL  (LDL-C of <70 mg/dL) is considered a therapeutic  option.   TSH     Status: None   Collection Time: 05/17/21 12:00 AM  Result Value Ref Range   TSH 0.84 mIU/L    Comment:           Reference Range .           > or = 20 Years  0.40-4.50 .                Pregnancy Ranges           First  trimester    0.26-2.66           Second trimester   0.55-2.73           Third trimester    0.43-2.91     No results found.     All questions at time of visit were answered - patient instructed to contact office with any additional concerns or updates. ER/RTC precautions were reviewed with the patient as applicable.   Please note: manual typing as well as voice recognition software may have been used to produce this document - typos may escape review. Please contact Dr. Sheppard Coil for any needed clarifications.

## 2021-05-18 LAB — CBC
HCT: 41.6 % (ref 35.0–45.0)
Hemoglobin: 13.6 g/dL (ref 11.7–15.5)
MCH: 32.5 pg (ref 27.0–33.0)
MCHC: 32.7 g/dL (ref 32.0–36.0)
MCV: 99.5 fL (ref 80.0–100.0)
MPV: 11.3 fL (ref 7.5–12.5)
Platelets: 268 10*3/uL (ref 140–400)
RBC: 4.18 10*6/uL (ref 3.80–5.10)
RDW: 11.5 % (ref 11.0–15.0)
WBC: 4.7 10*3/uL (ref 3.8–10.8)

## 2021-05-18 LAB — COMPLETE METABOLIC PANEL WITH GFR
AG Ratio: 1.8 (calc) (ref 1.0–2.5)
ALT: 11 U/L (ref 6–29)
AST: 13 U/L (ref 10–30)
Albumin: 4.6 g/dL (ref 3.6–5.1)
Alkaline phosphatase (APISO): 42 U/L (ref 31–125)
BUN: 13 mg/dL (ref 7–25)
CO2: 28 mmol/L (ref 20–32)
Calcium: 9.7 mg/dL (ref 8.6–10.2)
Chloride: 102 mmol/L (ref 98–110)
Creat: 0.65 mg/dL (ref 0.50–1.10)
GFR, Est African American: 131 mL/min/{1.73_m2} (ref >=60)
GFR, Est Non African American: 113 mL/min/{1.73_m2} (ref >=60)
Globulin: 2.5 g/dL (calc) (ref 1.9–3.7)
Glucose, Bld: 80 mg/dL (ref 65–99)
Potassium: 4.7 mmol/L (ref 3.5–5.3)
Sodium: 139 mmol/L (ref 135–146)
Total Bilirubin: 0.5 mg/dL (ref 0.2–1.2)
Total Protein: 7.1 g/dL (ref 6.1–8.1)

## 2021-05-18 LAB — TSH: TSH: 0.84 mIU/L

## 2021-05-18 LAB — LIPID PANEL
Cholesterol: 159 mg/dL (ref <200)
HDL: 55 mg/dL (ref >=50)
LDL Cholesterol (Calc): 93 mg/dL (calc)
Non-HDL Cholesterol (Calc): 104 mg/dL (calc) (ref <130)
Total CHOL/HDL Ratio: 2.9 (calc) (ref <5.0)
Triglycerides: 36 mg/dL (ref <150)

## 2021-06-02 ENCOUNTER — Encounter: Payer: Self-pay | Admitting: Osteopathic Medicine

## 2021-06-02 ENCOUNTER — Ambulatory Visit (INDEPENDENT_AMBULATORY_CARE_PROVIDER_SITE_OTHER): Payer: 59

## 2021-06-02 ENCOUNTER — Other Ambulatory Visit: Payer: Self-pay

## 2021-06-02 ENCOUNTER — Telehealth (INDEPENDENT_AMBULATORY_CARE_PROVIDER_SITE_OTHER): Payer: 59 | Admitting: Osteopathic Medicine

## 2021-06-02 DIAGNOSIS — Z1231 Encounter for screening mammogram for malignant neoplasm of breast: Secondary | ICD-10-CM

## 2021-06-02 DIAGNOSIS — Z803 Family history of malignant neoplasm of breast: Secondary | ICD-10-CM

## 2021-06-10 ENCOUNTER — Encounter: Payer: Self-pay | Admitting: Osteopathic Medicine

## 2021-06-15 ENCOUNTER — Other Ambulatory Visit (HOSPITAL_COMMUNITY): Payer: Self-pay

## 2021-07-01 ENCOUNTER — Encounter: Payer: Self-pay | Admitting: Osteopathic Medicine

## 2021-07-07 ENCOUNTER — Other Ambulatory Visit (HOSPITAL_COMMUNITY): Payer: Self-pay

## 2021-07-07 MED ORDER — CHLORHEXIDINE GLUCONATE 0.12 % MT SOLN
OROMUCOSAL | 0 refills | Status: DC
  Filled 2021-07-07: qty 473, 15d supply, fill #0

## 2021-07-18 NOTE — Telephone Encounter (Signed)
error 

## 2021-07-20 IMAGING — MG MM DIGITAL SCREENING BILAT W/ TOMO AND CAD
8 series · 8 of 24 positions shown · non-contrast
Comparison: Previous exam(s).

CLINICAL DATA: Screening.

EXAM:
DIGITAL SCREENING BILATERAL MAMMOGRAM WITH TOMOSYNTHESIS AND CAD
TECHNIQUE: Bilateral screening digital craniocaudal and mediolateral oblique
mammograms were obtained. Bilateral screening digital breast
tomosynthesis was performed. The images were evaluated with
computer-aided detection.

[L CC synth-2D]
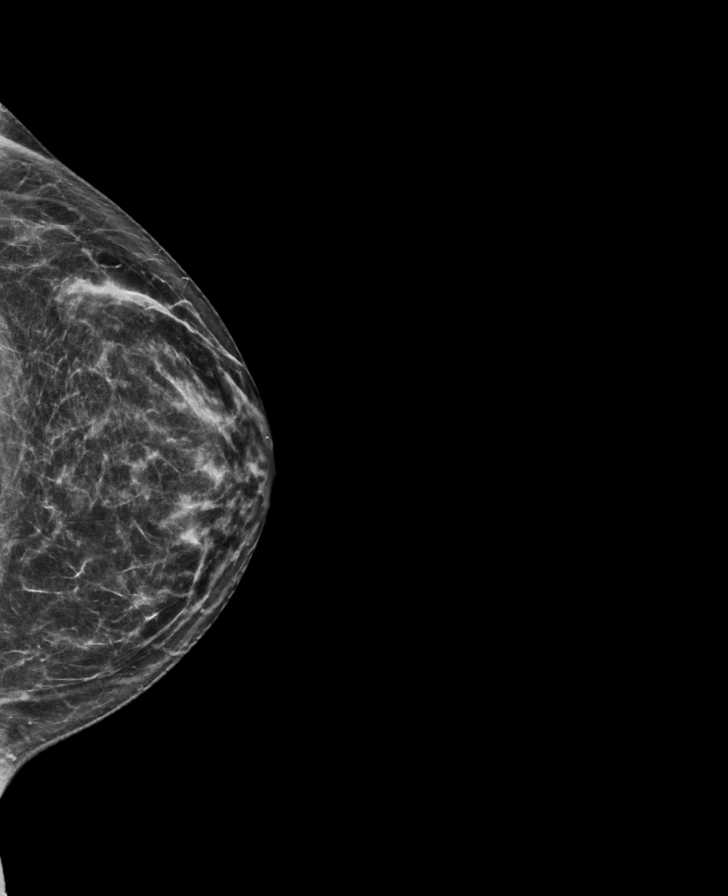

[R CC synth-2D]
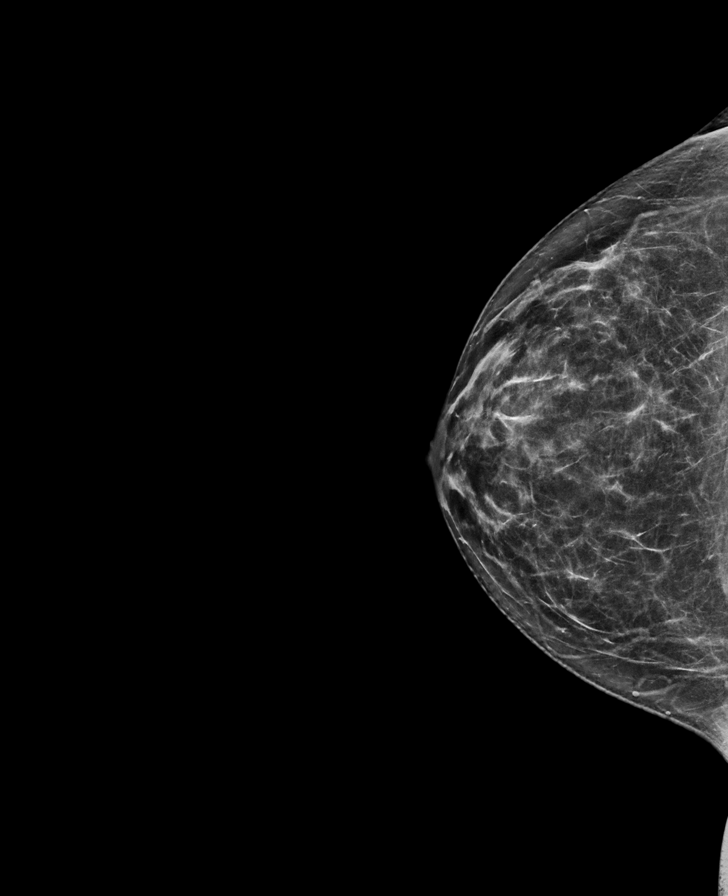

[L MLO synth-2D]
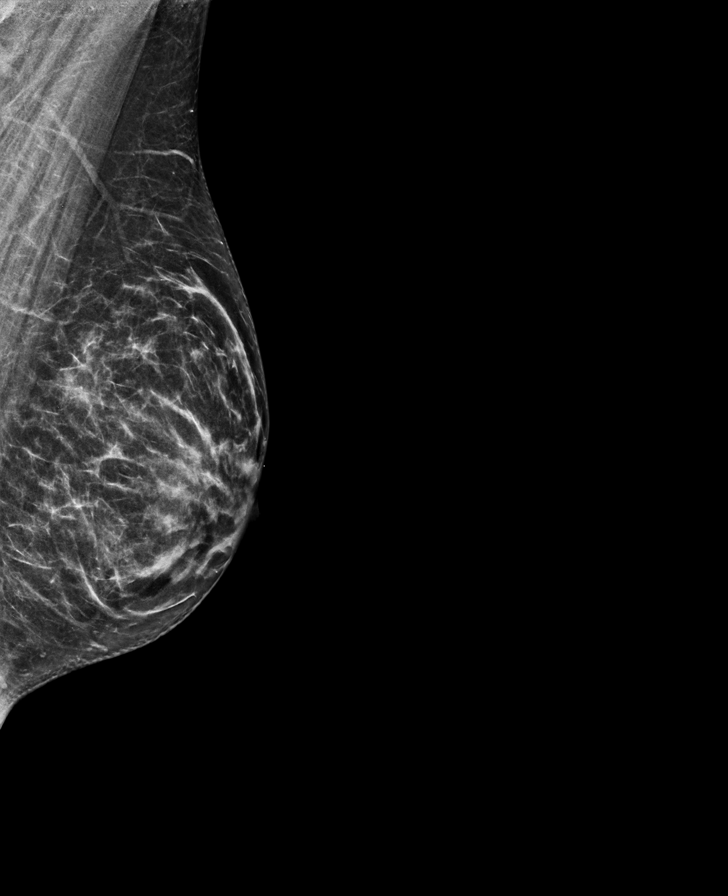

[R MLO synth-2D]
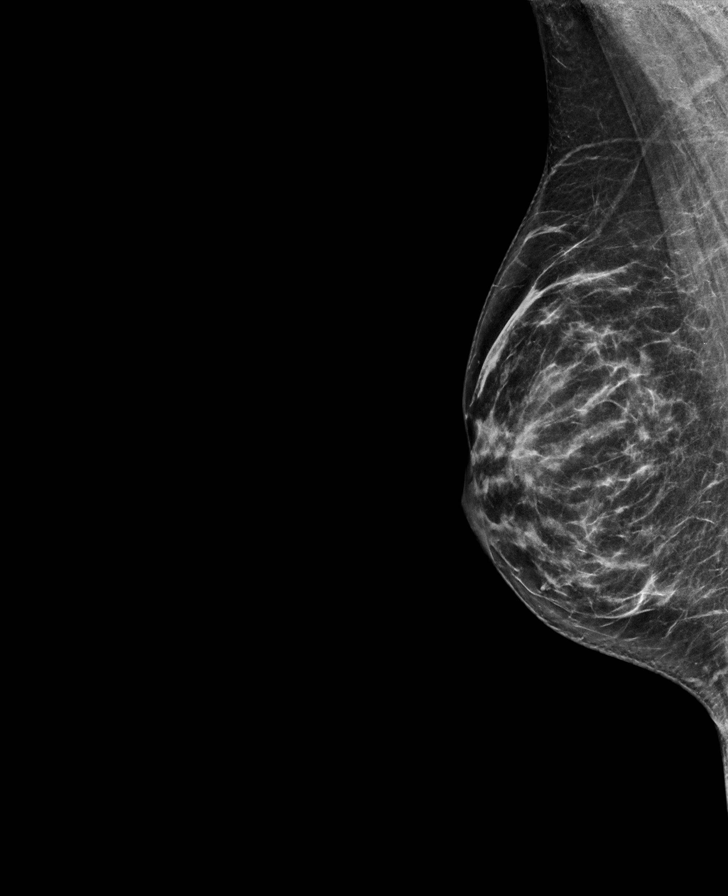

[L CC tomo · tomo slice 35/68.0]
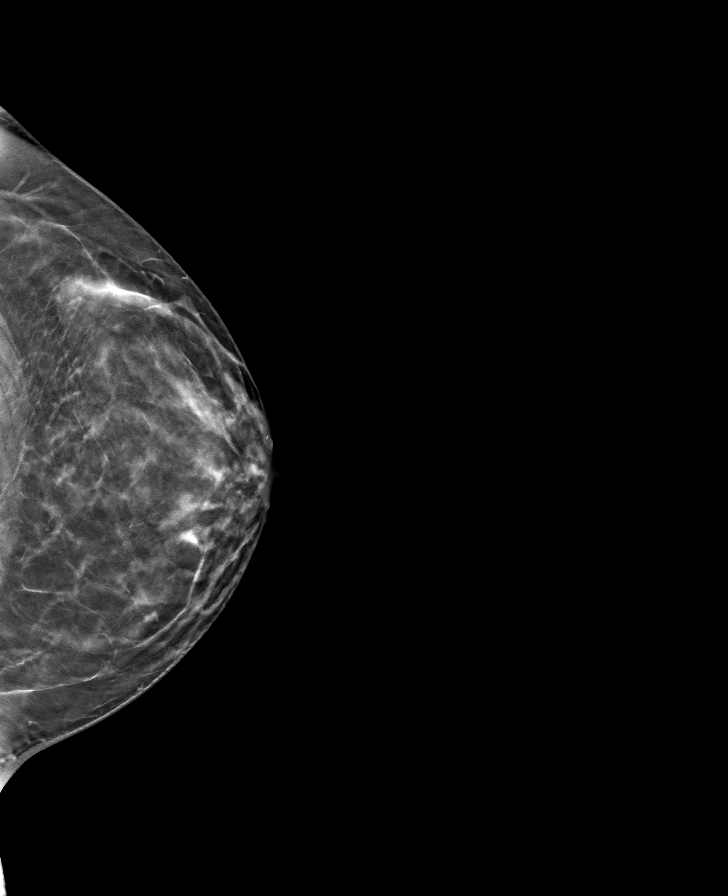

[R MLO tomo · tomo slice 36/71.0]
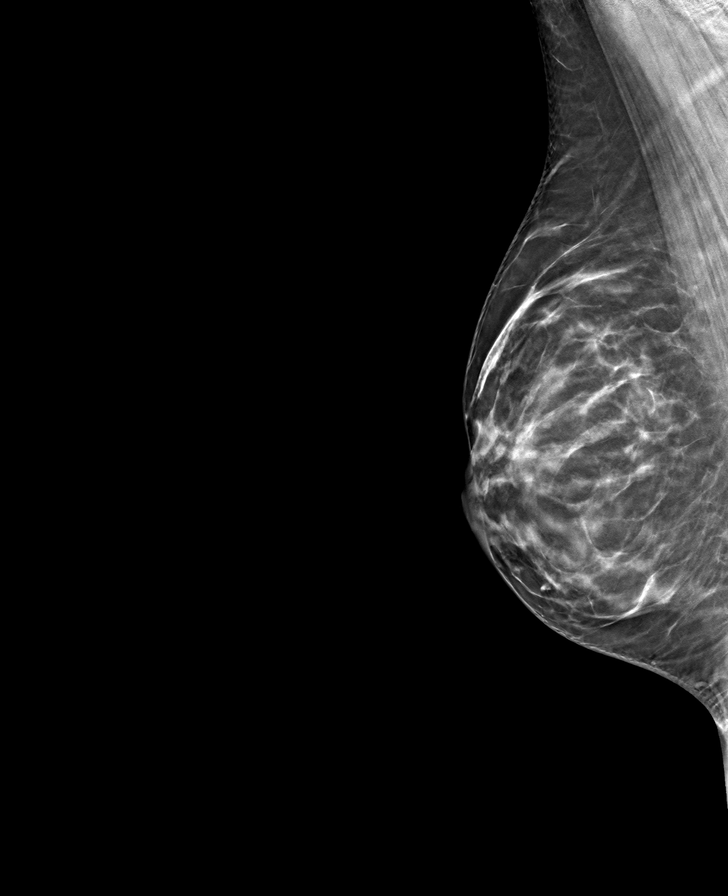

[R CC tomo · tomo slice 36/71.0]
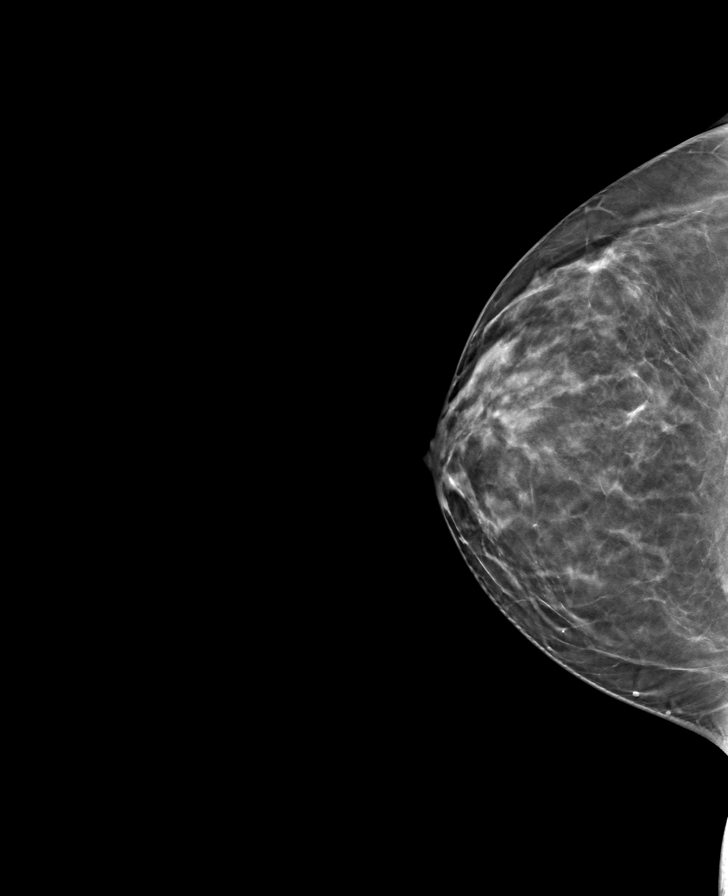

[L MLO tomo · tomo slice 34/67.0]
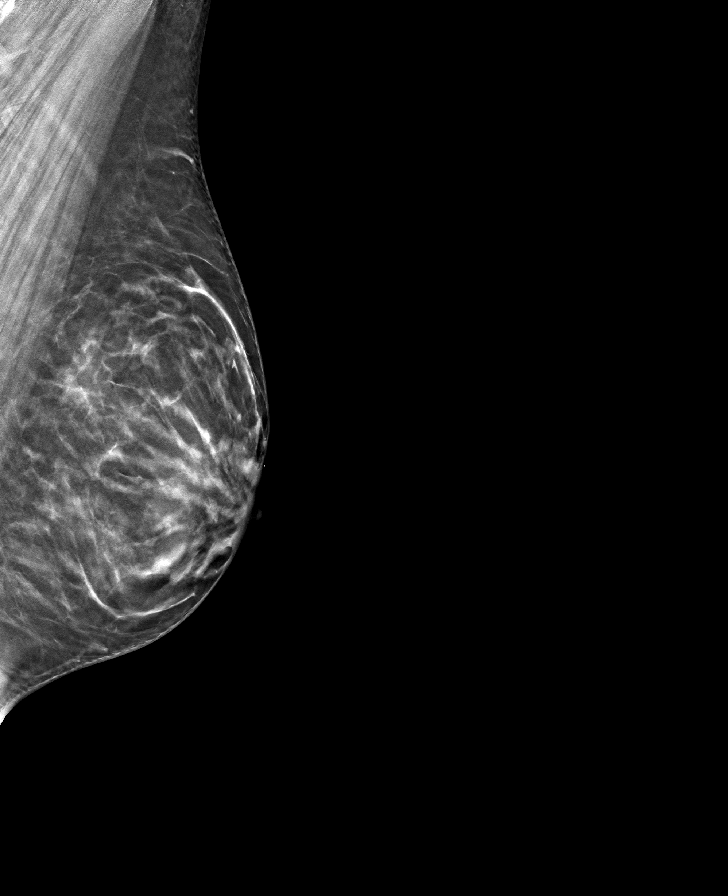

[8 of 24 positions shown; findings below may reference images not displayed]

ACR Breast Density Category b: There are scattered areas of
fibroglandular density.
FINDINGS: There are no findings suspicious for malignancy.
IMPRESSION: No mammographic evidence of malignancy. A result letter of this
screening mammogram will be mailed directly to the patient.

RECOMMENDATION:
Screening mammogram at age 40. (Code:[id number]-[id number])

BI-RADS CATEGORY  1: Negative.

## 2021-08-19 ENCOUNTER — Encounter: Payer: Self-pay | Admitting: Osteopathic Medicine

## 2021-12-12 ENCOUNTER — Other Ambulatory Visit (HOSPITAL_COMMUNITY): Payer: Self-pay

## 2021-12-14 ENCOUNTER — Other Ambulatory Visit (HOSPITAL_COMMUNITY): Payer: Self-pay

## 2022-01-31 ENCOUNTER — Other Ambulatory Visit: Payer: Self-pay

## 2022-01-31 ENCOUNTER — Encounter: Payer: Self-pay | Admitting: Medical-Surgical

## 2022-01-31 ENCOUNTER — Ambulatory Visit: Payer: 59 | Admitting: Medical-Surgical

## 2022-01-31 ENCOUNTER — Other Ambulatory Visit (HOSPITAL_COMMUNITY): Payer: Self-pay

## 2022-01-31 VITALS — BP 99/64 | HR 65 | Resp 20 | Ht 64.5 in | Wt 166.1 lb

## 2022-01-31 DIAGNOSIS — M79651 Pain in right thigh: Secondary | ICD-10-CM

## 2022-01-31 DIAGNOSIS — Z7689 Persons encountering health services in other specified circumstances: Secondary | ICD-10-CM

## 2022-01-31 MED ORDER — PREDNISONE 20 MG PO TABS
40.0000 mg | ORAL_TABLET | Freq: Every day | ORAL | 0 refills | Status: DC
  Filled 2022-01-31: qty 10, 5d supply, fill #0

## 2022-01-31 MED ORDER — MELOXICAM 7.5 MG PO TABS
7.5000 mg | ORAL_TABLET | Freq: Every day | ORAL | 0 refills | Status: DC
  Filled 2022-01-31: qty 30, 30d supply, fill #0

## 2022-01-31 NOTE — Progress Notes (Signed)
?  HPI with pertinent ROS:  ? ?CC: right leg pain, TOC ? ?HPI: ?Pleasant 36 year old female presenting today to transfer care to a new PCP and for right leg pain. Notes that she was diagnosed with a right knee Baker's cyst last year and had some associated posterior knee pain. She did conservative management for that which helped to resolve the pain. Unfortunately, about 8 weeks ago, she developed posterior thigh pain that has gotten progressively worse. She reports a 3/10 at best, 7/10 at worst, constant dull pain with occasional sharp pains (when at the worst). Was previously exercising but the discomfort has gotten bad enough that she has had to stop. Has some intermittent numbness and tingling in the right great toe but no other erythema, edema, or neurovascular changes. History of low back pain/injury several years ago.  ? ?I reviewed the past medical history, family history, social history, surgical history, and allergies today and no changes were needed.  Please see the problem list section below in epic for further details. ? ? ?Physical exam:  ? ?General: Well Developed, well nourished, and in no acute distress.  ?Neuro: Alert and oriented x3.  ?HEENT: Normocephalic, atraumatic.  ?Skin: Warm and dry. ?Cardiac: Regular rate and rhythm, no murmurs rubs or gallops, no lower extremity edema.  ?Respiratory: Clear to auscultation bilaterally. Not using accessory muscles, speaking in full sentences. ?MSK: - straight leg raise test, mildly positive FABER bilaterally, mild tenderness to the posterior right thigh. Full ROM and strength to the right hip and knee. Right DP and PT pulses positive. ? ?Impression and Recommendations:   ? ?1. Musculoskeletal pain of right thigh ?Suspect lumbar spine etiology. Discussed x-rays but will hold off on imaging for now. Prednisone burst x 5 days then start Meloxicam 7.'5mg'$  daily (patient medication sensitive). Referring to PT.  ?- predniSONE (DELTASONE) 20 MG tablet; Take 2 tablets  (40 mg total) by mouth daily with breakfast.  Dispense: 10 tablet; Refill: 0 ?- meloxicam (MOBIC) 7.5 MG tablet; Take 1 tablet by mouth daily. Start after completing prednisone burst.  Dispense: 30 tablet; Refill: 0 ?- Ambulatory referral to Physical Therapy ? ?2. Encounter to establish care ?Reviewed available information and discussed care concerns with patient.  ? ?Return in about 6 weeks (around 03/14/2022) for right thigh/back pain if no improvement . ?___________________________________________ ?Clearnce Sorrel, DNP, APRN, FNP-BC ?Primary Care and Sports Medicine ?[place] ?

## 2022-02-08 ENCOUNTER — Ambulatory Visit: Payer: 59 | Admitting: Rehabilitative and Restorative Service Providers"

## 2022-03-16 ENCOUNTER — Ambulatory Visit: Payer: 59 | Admitting: Medical-Surgical

## 2022-03-20 ENCOUNTER — Ambulatory Visit (INDEPENDENT_AMBULATORY_CARE_PROVIDER_SITE_OTHER): Payer: Self-pay | Admitting: Medical-Surgical

## 2022-03-20 DIAGNOSIS — Z91199 Patient's noncompliance with other medical treatment and regimen due to unspecified reason: Secondary | ICD-10-CM

## 2022-03-20 NOTE — Progress Notes (Signed)
   Complete physical exam  Patient: Olivia Sutton   DOB: 09/16/2001   36 y.o. Female  MRN: [id number]  Subjective:    No chief complaint on file.   Olivia Sutton is a 36 y.o. female who presents today for a complete physical exam. She reports consuming a {diet types:17450} diet. {types:19826} She generally feels {DESC; WELL/FAIRLY WELL/POORLY:18703}. She reports sleeping {DESC; WELL/FAIRLY WELL/POORLY:18703}. She {does/does not:200015} have additional problems to discuss today.    Most recent fall risk assessment:    05/24/2022   10:42 AM  Fall Risk   Falls in the past year? 0  Number falls in past yr: 0  Injury with Fall? 0  Risk for fall due to : No Fall Risks  Follow up Falls evaluation completed     Most recent depression screenings:    05/24/2022   10:42 AM 04/14/2021   10:46 AM  PHQ 2/9 Scores  PHQ - 2 Score 0 0  PHQ- 9 Score 5     {VISON DENTAL STD PSA (Optional):27386}  {History (Optional):23778}  Patient Care Team: Vannah Nadal, NP as PCP - General (Nurse Practitioner)   Outpatient Medications Prior to Visit  Medication Sig   fluticasone (FLONASE) 50 MCG/ACT nasal spray Place 2 sprays into both nostrils in the morning and at bedtime. After 7 days, reduce to once daily.   norgestimate-ethinyl estradiol (SPRINTEC 28) 0.25-35 MG-MCG tablet Take 1 tablet by mouth daily.   Nystatin POWD Apply liberally to affected area 2 times per day   spironolactone (ALDACTONE) 100 MG tablet Take 1 tablet (100 mg total) by mouth daily.   No facility-administered medications prior to visit.    ROS        Objective:     There were no vitals taken for this visit. {Vitals History (Optional):23777}  Physical Exam   No results found for any visits on 06/29/22. {Show previous labs (optional):23779}    Assessment & Plan:    Routine Health Maintenance and Physical Exam  Immunization History  Administered Date(s) Administered   DTaP 11/30/1999, 01/26/2000,  04/05/2000, 12/20/2000, 07/05/2004   Hepatitis A 05/01/2008, 05/07/2009   Hepatitis B 09/17/1999, 10/25/1999, 04/05/2000   HiB (PRP-OMP) 11/30/1999, 01/26/2000, 04/05/2000, 12/20/2000   IPV 11/30/1999, 01/26/2000, 09/24/2000, 07/05/2004   Influenza,inj,Quad PF,6+ Mos 08/07/2014   Influenza-Unspecified 11/06/2012   MMR 09/24/2001, 07/05/2004   Meningococcal Polysaccharide 05/06/2012   Pneumococcal Conjugate-13 12/20/2000   Pneumococcal-Unspecified 04/05/2000, 06/19/2000   Tdap 05/06/2012   Varicella 09/24/2000, 05/01/2008    Health Maintenance  Topic Date Due   HIV Screening  Never done   Hepatitis C Screening  Never done   INFLUENZA VACCINE  06/27/2022   PAP-Cervical Cytology Screening  06/29/2022 (Originally 09/15/2020)   PAP SMEAR-Modifier  06/29/2022 (Originally 09/15/2020)   TETANUS/TDAP  06/29/2022 (Originally 05/06/2022)   HPV VACCINES  Discontinued   COVID-19 Vaccine  Discontinued    Discussed health benefits of physical activity, and encouraged her to engage in regular exercise appropriate for her age and condition.  Problem List Items Addressed This Visit   None Visit Diagnoses     Annual physical exam    -  Primary   Cervical cancer screening       Need for Tdap vaccination          No follow-ups on file.     Guy Toney, NP   

## 2022-04-25 ENCOUNTER — Other Ambulatory Visit (HOSPITAL_COMMUNITY): Payer: Self-pay

## 2022-06-21 DIAGNOSIS — F411 Generalized anxiety disorder: Secondary | ICD-10-CM | POA: Diagnosis not present

## 2022-06-27 ENCOUNTER — Other Ambulatory Visit (HOSPITAL_COMMUNITY): Payer: Self-pay

## 2022-06-27 MED ORDER — AMOXICILLIN-POT CLAVULANATE 500-125 MG PO TABS
ORAL_TABLET | ORAL | 0 refills | Status: DC
  Filled 2022-06-27 – 2022-07-11 (×2): qty 21, 7d supply, fill #0

## 2022-07-05 ENCOUNTER — Encounter (HOSPITAL_COMMUNITY): Payer: Self-pay | Admitting: Pharmacist

## 2022-07-05 ENCOUNTER — Other Ambulatory Visit (HOSPITAL_COMMUNITY): Payer: Self-pay

## 2022-07-07 ENCOUNTER — Other Ambulatory Visit (HOSPITAL_COMMUNITY): Payer: Self-pay

## 2022-07-11 ENCOUNTER — Other Ambulatory Visit (HOSPITAL_COMMUNITY): Payer: Self-pay

## 2022-07-16 NOTE — Progress Notes (Unsigned)
   Established Patient Office Visit  Subjective   Patient ID: Olivia Sutton, female   DOB: 05-24-1986 Age: 36 y.o. MRN: [id number]   No chief complaint on file.   HPI    Objective:    There were no vitals filed for this visit.  Physical Exam   No results found for this or any previous visit (from the past 24 hour(s)).   {Labs (Optional):23779}  The ASCVD Risk score (Arnett DK, et al., 2019) failed to calculate for the following reasons:   The 2019 ASCVD risk score is only valid for ages 55 to 68   Assessment & Plan:   No problem-specific Assessment & Plan notes found for this encounter.   No follow-ups on file.  ___________________________________________ Clearnce Sorrel, DNP, APRN, FNP-BC Primary Care and [facility]

## 2022-07-17 ENCOUNTER — Encounter: Payer: Self-pay | Admitting: Medical-Surgical

## 2022-07-17 ENCOUNTER — Ambulatory Visit: Payer: 59 | Admitting: Medical-Surgical

## 2022-07-17 VITALS — BP 105/70 | HR 58 | Resp 20 | Ht 64.5 in | Wt 161.5 lb

## 2022-07-17 DIAGNOSIS — R1012 Left upper quadrant pain: Secondary | ICD-10-CM

## 2022-07-17 DIAGNOSIS — R079 Chest pain, unspecified: Secondary | ICD-10-CM | POA: Diagnosis not present

## 2022-07-17 DIAGNOSIS — Z0000 Encounter for general adult medical examination without abnormal findings: Secondary | ICD-10-CM

## 2022-07-17 DIAGNOSIS — R519 Headache, unspecified: Secondary | ICD-10-CM | POA: Diagnosis not present

## 2022-07-17 DIAGNOSIS — R221 Localized swelling, mass and lump, neck: Secondary | ICD-10-CM | POA: Diagnosis not present

## 2022-07-18 LAB — CBC WITH DIFFERENTIAL/PLATELET
Absolute Monocytes: 490 cells/uL (ref 200–950)
Basophils Absolute: 39 cells/uL (ref 0–200)
Basophils Relative: 0.7 %
Eosinophils Absolute: 121 cells/uL (ref 15–500)
Eosinophils Relative: 2.2 %
HCT: 39.3 % (ref 35.0–45.0)
Hemoglobin: 13.1 g/dL (ref 11.7–15.5)
Lymphs Abs: 2327 cells/uL (ref 850–3900)
MCH: 33.2 pg — ABNORMAL HIGH (ref 27.0–33.0)
MCHC: 33.3 g/dL (ref 32.0–36.0)
MCV: 99.7 fL (ref 80.0–100.0)
MPV: 11.5 fL (ref 7.5–12.5)
Monocytes Relative: 8.9 %
Neutro Abs: 2525 cells/uL (ref 1500–7800)
Neutrophils Relative %: 45.9 %
Platelets: 252 10*3/uL (ref 140–400)
RBC: 3.94 10*6/uL (ref 3.80–5.10)
RDW: 11.6 % (ref 11.0–15.0)
Total Lymphocyte: 42.3 %
WBC: 5.5 10*3/uL (ref 3.8–10.8)

## 2022-07-18 LAB — COMPLETE METABOLIC PANEL WITH GFR
AG Ratio: 1.6 (calc) (ref 1.0–2.5)
ALT: 9 U/L (ref 6–29)
AST: 15 U/L (ref 10–30)
Albumin: 4.6 g/dL (ref 3.6–5.1)
Alkaline phosphatase (APISO): 53 U/L (ref 31–125)
BUN: 11 mg/dL (ref 7–25)
CO2: 25 mmol/L (ref 20–32)
Calcium: 9.5 mg/dL (ref 8.6–10.2)
Chloride: 107 mmol/L (ref 98–110)
Creat: 0.62 mg/dL (ref 0.50–0.97)
Globulin: 2.8 g/dL (calc) (ref 1.9–3.7)
Glucose, Bld: 83 mg/dL (ref 65–99)
Potassium: 3.9 mmol/L (ref 3.5–5.3)
Sodium: 141 mmol/L (ref 135–146)
Total Bilirubin: 0.3 mg/dL (ref 0.2–1.2)
Total Protein: 7.4 g/dL (ref 6.1–8.1)
eGFR: 117 mL/min/{1.73_m2} (ref >=60)

## 2022-07-18 LAB — LIPID PANEL
Cholesterol: 140 mg/dL (ref <200)
HDL: 52 mg/dL (ref >=50)
LDL Cholesterol (Calc): 75 mg/dL (calc)
Non-HDL Cholesterol (Calc): 88 mg/dL (calc) (ref <130)
Total CHOL/HDL Ratio: 2.7 (calc) (ref <5.0)
Triglycerides: 47 mg/dL (ref <150)

## 2022-07-19 ENCOUNTER — Ambulatory Visit (INDEPENDENT_AMBULATORY_CARE_PROVIDER_SITE_OTHER): Payer: 59

## 2022-07-19 DIAGNOSIS — R1012 Left upper quadrant pain: Secondary | ICD-10-CM

## 2022-07-19 DIAGNOSIS — R519 Headache, unspecified: Secondary | ICD-10-CM | POA: Diagnosis not present

## 2022-07-19 DIAGNOSIS — R221 Localized swelling, mass and lump, neck: Secondary | ICD-10-CM

## 2022-07-19 DIAGNOSIS — R109 Unspecified abdominal pain: Secondary | ICD-10-CM | POA: Diagnosis not present

## 2022-07-19 DIAGNOSIS — R079 Chest pain, unspecified: Secondary | ICD-10-CM

## 2022-07-19 DIAGNOSIS — F411 Generalized anxiety disorder: Secondary | ICD-10-CM | POA: Diagnosis not present

## 2022-07-19 DIAGNOSIS — M542 Cervicalgia: Secondary | ICD-10-CM | POA: Diagnosis not present

## 2022-07-20 ENCOUNTER — Other Ambulatory Visit: Payer: Self-pay | Admitting: Medical-Surgical

## 2022-07-20 DIAGNOSIS — R079 Chest pain, unspecified: Secondary | ICD-10-CM

## 2022-07-20 DIAGNOSIS — R519 Headache, unspecified: Secondary | ICD-10-CM

## 2022-07-20 DIAGNOSIS — R220 Localized swelling, mass and lump, head: Secondary | ICD-10-CM

## 2022-07-20 DIAGNOSIS — R1012 Left upper quadrant pain: Secondary | ICD-10-CM

## 2022-07-21 ENCOUNTER — Other Ambulatory Visit (HOSPITAL_COMMUNITY): Payer: Self-pay

## 2022-07-21 ENCOUNTER — Other Ambulatory Visit: Payer: Self-pay | Admitting: Osteopathic Medicine

## 2022-07-21 MED ORDER — SERTRALINE HCL 50 MG PO TABS
50.0000 mg | ORAL_TABLET | Freq: Every day | ORAL | 0 refills | Status: DC
  Filled 2022-07-21: qty 90, 90d supply, fill #0

## 2022-07-25 ENCOUNTER — Ambulatory Visit (INDEPENDENT_AMBULATORY_CARE_PROVIDER_SITE_OTHER): Payer: 59

## 2022-07-25 ENCOUNTER — Ambulatory Visit: Payer: 59

## 2022-07-25 DIAGNOSIS — R079 Chest pain, unspecified: Secondary | ICD-10-CM

## 2022-07-25 DIAGNOSIS — R220 Localized swelling, mass and lump, head: Secondary | ICD-10-CM | POA: Diagnosis not present

## 2022-07-25 DIAGNOSIS — K7689 Other specified diseases of liver: Secondary | ICD-10-CM | POA: Diagnosis not present

## 2022-07-25 DIAGNOSIS — R1012 Left upper quadrant pain: Secondary | ICD-10-CM

## 2022-07-25 DIAGNOSIS — J320 Chronic maxillary sinusitis: Secondary | ICD-10-CM | POA: Diagnosis not present

## 2022-07-25 DIAGNOSIS — R519 Headache, unspecified: Secondary | ICD-10-CM | POA: Diagnosis not present

## 2022-07-25 MED ORDER — IOHEXOL 300 MG/ML  SOLN
100.0000 mL | Freq: Once | INTRAMUSCULAR | Status: AC | PRN
  Administered 2022-07-25: 75 mL via INTRAVENOUS

## 2022-07-25 MED ORDER — GADOBUTROL 1 MMOL/ML IV SOLN
7.5000 mL | Freq: Once | INTRAVENOUS | Status: AC | PRN
  Administered 2022-07-25: 7.5 mL via INTRAVENOUS

## 2022-07-26 NOTE — Progress Notes (Unsigned)
   Complete physical exam  Patient: Olivia Sutton   DOB: Apr 13, 1986   36 y.o. Female  MRN: [id number]  Subjective:    No chief complaint on file.   Olivia Sutton is a 36 y.o. female who presents today for a complete physical exam. She reports consuming a {diet types:17450} diet. {types:19826} She generally feels {DESC; WELL/FAIRLY WELL/POORLY:18703}. She reports sleeping {DESC; WELL/FAIRLY WELL/POORLY:18703}. She {does/does not:200015} have additional problems to discuss today.    Most recent fall risk assessment:    01/31/2022   10:33 AM  Fall Risk   Falls in the past year? 0  Number falls in past yr: 0  Injury with Fall? 0  Risk for fall due to : No Fall Risks  Follow up Falls evaluation completed     Most recent depression screenings:    07/17/2022    3:55 PM 01/31/2022   10:33 AM  PHQ 2/9 Scores  PHQ - 2 Score 0 0    {VISON DENTAL STD PSA (Optional):27386}  {History (Optional):23778}  Patient Care Team: Samuel Bouche, NP as PCP - General (Nurse Practitioner)   Outpatient Medications Prior to Visit  Medication Sig   acetaminophen (TYLENOL) 500 MG tablet Take 500 mg by mouth every 6 (six) hours as needed.   amoxicillin-clavulanate (AUGMENTIN) 500-125 MG tablet TAKE 1 TABLET BY MOUTH 3 TIMES DAILY UNTIL GONE.   ibuprofen (ADVIL) 200 MG tablet Take 200 mg by mouth every 6 (six) hours as needed.   sertraline (ZOLOFT) 50 MG tablet Take 1 tablet (50 mg total) by mouth daily.   No facility-administered medications prior to visit.    ROS        Objective:     LMP 07/11/2022 (Approximate)  {Vitals History (Optional):23777}  Physical Exam   No results found for any visits on 07/27/22. {Show previous labs (optional):23779}    Assessment & Plan:    Routine Health Maintenance and Physical Exam  Immunization History  Administered Date(s) Administered   Influenza,inj,Quad PF,6+ Mos 09/08/2018, 01/21/2019, 08/20/2019   Influenza-Unspecified 10/08/2009,  08/31/2015, 08/20/2020, 08/27/2021   PFIZER(Purple Top)SARS-COV-2 Vaccination 11/10/2019, 11/24/2019, 09/03/2020   PPD Test 03/24/2014, 03/24/2015, 03/21/2016   Tdap 06/20/2010, 02/26/2018, 01/16/2019    Health Maintenance  Topic Date Due   Hepatitis C Screening  Never done   INFLUENZA VACCINE  08/17/2022 (Originally 06/27/2022)   COVID-19 Vaccine (4 - Pfizer series) 11/16/2022 (Originally 10/29/2020)   PAP SMEAR-Modifier  04/13/2025   TETANUS/TDAP  01/16/2029   HIV Screening  Completed   HPV VACCINES  Aged Out    Discussed health benefits of physical activity, and encouraged her to engage in regular exercise appropriate for her age and condition.  Problem List Items Addressed This Visit   None Visit Diagnoses     Annual physical exam    -  Primary      No follow-ups on file.     Samuel Bouche, NP

## 2022-07-27 ENCOUNTER — Ambulatory Visit: Payer: 59 | Admitting: Medical-Surgical

## 2022-07-27 ENCOUNTER — Encounter: Payer: Self-pay | Admitting: Medical-Surgical

## 2022-07-27 VITALS — BP 93/59 | HR 56 | Resp 20 | Ht 64.5 in | Wt 163.4 lb

## 2022-07-27 DIAGNOSIS — Z808 Family history of malignant neoplasm of other organs or systems: Secondary | ICD-10-CM

## 2022-07-27 DIAGNOSIS — Z0000 Encounter for general adult medical examination without abnormal findings: Secondary | ICD-10-CM

## 2022-08-04 ENCOUNTER — Encounter: Payer: Self-pay | Admitting: Medical-Surgical

## 2022-08-04 ENCOUNTER — Other Ambulatory Visit (HOSPITAL_COMMUNITY): Payer: Self-pay

## 2022-08-04 MED ORDER — PANTOPRAZOLE SODIUM 40 MG PO TBEC
40.0000 mg | DELAYED_RELEASE_TABLET | Freq: Every day | ORAL | 3 refills | Status: DC
Start: 2022-08-04 — End: 2022-08-04
  Filled 2022-08-04: qty 30, 30d supply, fill #0

## 2022-08-07 ENCOUNTER — Other Ambulatory Visit (HOSPITAL_COMMUNITY): Payer: Self-pay

## 2022-08-07 MED ORDER — PHENTERMINE HCL 15 MG PO CAPS
15.0000 mg | ORAL_CAPSULE | ORAL | 0 refills | Status: DC
  Filled 2022-08-07: qty 30, 30d supply, fill #0

## 2022-08-07 MED ORDER — PHENTERMINE HCL 15 MG PO CAPS: 15.0000 mg | ORAL_CAPSULE | ORAL | 0 refills | Status: DC

## 2022-08-07 NOTE — Addendum Note (Signed)
Addended bySamuel Bouche on: 08/07/2022 10:30 AM   Modules accepted: Orders

## 2022-08-16 DIAGNOSIS — F411 Generalized anxiety disorder: Secondary | ICD-10-CM | POA: Diagnosis not present

## 2022-08-23 DIAGNOSIS — H52203 Unspecified astigmatism, bilateral: Secondary | ICD-10-CM | POA: Diagnosis not present

## 2022-09-13 ENCOUNTER — Encounter: Payer: Self-pay | Admitting: Medical-Surgical

## 2022-10-09 DIAGNOSIS — L908 Other atrophic disorders of skin: Secondary | ICD-10-CM | POA: Diagnosis not present

## 2022-10-09 DIAGNOSIS — D225 Melanocytic nevi of trunk: Secondary | ICD-10-CM | POA: Diagnosis not present

## 2022-10-09 DIAGNOSIS — L814 Other melanin hyperpigmentation: Secondary | ICD-10-CM | POA: Diagnosis not present

## 2022-10-09 DIAGNOSIS — L700 Acne vulgaris: Secondary | ICD-10-CM | POA: Diagnosis not present

## 2022-11-02 DIAGNOSIS — F411 Generalized anxiety disorder: Secondary | ICD-10-CM | POA: Diagnosis not present

## 2022-12-07 DIAGNOSIS — F411 Generalized anxiety disorder: Secondary | ICD-10-CM | POA: Diagnosis not present

## 2022-12-25 ENCOUNTER — Other Ambulatory Visit: Payer: Self-pay | Admitting: Medical-Surgical

## 2022-12-25 ENCOUNTER — Other Ambulatory Visit (HOSPITAL_COMMUNITY): Payer: Self-pay

## 2022-12-25 ENCOUNTER — Other Ambulatory Visit: Payer: Self-pay

## 2022-12-25 MED ORDER — SERTRALINE HCL 50 MG PO TABS
50.0000 mg | ORAL_TABLET | Freq: Every day | ORAL | 0 refills | Status: DC
  Filled 2022-12-25: qty 90, 90d supply, fill #0

## 2022-12-25 NOTE — Telephone Encounter (Signed)
As of last communication via [facility], patient reported the medication caused "horrific palpitations" and she was planning to go about weight loss the natural way. If desired to retry the medication, will need an appointment.

## 2023-01-03 DIAGNOSIS — F411 Generalized anxiety disorder: Secondary | ICD-10-CM | POA: Diagnosis not present

## 2023-01-16 ENCOUNTER — Other Ambulatory Visit (HOSPITAL_COMMUNITY): Payer: Self-pay

## 2023-01-29 DIAGNOSIS — F411 Generalized anxiety disorder: Secondary | ICD-10-CM | POA: Diagnosis not present

## 2023-02-20 ENCOUNTER — Other Ambulatory Visit (HOSPITAL_COMMUNITY): Payer: Self-pay

## 2023-02-20 ENCOUNTER — Other Ambulatory Visit: Payer: Self-pay

## 2023-02-20 MED ORDER — AMOXICILLIN 500 MG PO CAPS
500.0000 mg | ORAL_CAPSULE | Freq: Four times a day (QID) | ORAL | 0 refills | Status: DC
  Filled 2023-02-20: qty 25, 7d supply, fill #0

## 2023-03-08 ENCOUNTER — Telehealth: Payer: 59 | Admitting: Physician Assistant

## 2023-03-08 DIAGNOSIS — H1033 Unspecified acute conjunctivitis, bilateral: Secondary | ICD-10-CM | POA: Diagnosis not present

## 2023-03-08 MED ORDER — AZITHROMYCIN 1 % OP SOLN: OPHTHALMIC | 0 refills | Status: DC

## 2023-03-08 NOTE — Patient Instructions (Signed)
  Lamarr Lulas, thank you for joining Piedad Climes, PA-C for today's virtual visit.  While this provider is not your primary care provider (PCP), if your PCP is located in our provider database this encounter information will be shared with them immediately following your visit.   A [facility] MyChart account gives you access to today's visit and all your visits, tests, and labs performed at [facility] " click here if you don't have a [facility] MyChart account or go to [email or web address]  Consent: (Patient) Olivia Sutton provided verbal consent for this virtual visit at the beginning of the encounter.  Current Medications:  Current Outpatient Medications:    acetaminophen (TYLENOL) 500 MG tablet, Take 500 mg by mouth every 6 (six) hours as needed., Disp: , Rfl:    amoxicillin (AMOXIL) 500 MG capsule, Take 2 capsules by mouth now then 1 cap (500 mg total) by mouth every 6 (six) hours., Disp: 25 capsule, Rfl: 0   ibuprofen (ADVIL) 200 MG tablet, Take 200 mg by mouth every 6 (six) hours as needed., Disp: , Rfl:    phentermine 15 MG capsule, Take 1 capsule (15 mg total) by mouth every morning., Disp: 30 capsule, Rfl: 0   sertraline (ZOLOFT) 50 MG tablet, Take 1 tablet (50 mg total) by mouth daily. NEEDS APPOINTMENT FOR FURTHER REFILLS., Disp: 90 tablet, Rfl: 0   Medications ordered in this encounter:  No orders of the defined types were placed in this encounter.    *If you need refills on other medications prior to your next appointment, please contact your pharmacy*  Follow-Up: Call back or seek an in-person evaluation if the symptoms worsen or if the condition fails to improve as anticipated.  [facility] [phone]  Other Instructions Please keep hands washed and avoid touching the eyes. Start warm compresses to the eyes as directed. Use the prescribed antibiotic as directed. If symptoms are not substantially improving over the  next few days, or you note any new or worsening symptoms despite treatment, please seek an in person evaluation.  Do not delay care!   If you have been instructed to have an in-person evaluation today at a local Urgent Care facility, please use the link below. It will take you to a list of all of our available [facility], including address, phone number and hours of operation. Please do not delay care.  [facility]  If you or a family member do not have a primary care provider, use the link below to schedule a visit and establish care. When you choose a [facility] primary care physician or advanced practice provider, you gain a long-term partner in health. Find a Primary Care Provider  Learn more about [facility]'s in-office and virtual care options:  - Get Care Now

## 2023-03-08 NOTE — Progress Notes (Signed)
Virtual Visit Consent   Olivia Sutton, you are scheduled for a virtual visit with a [facility] provider today. Just as with appointments in the office, your consent must be obtained to participate. Your consent will be active for this visit and any virtual visit you may have with one of our providers in the next 365 days. If you have a MyChart account, a copy of this consent can be sent to you electronically.  As this is a virtual visit, video technology does not allow for your provider to perform a traditional examination. This may limit your provider's ability to fully assess your condition. If your provider identifies any concerns that need to be evaluated in person or the need to arrange testing (such as labs, EKG, etc.), we will make arrangements to do so. Although advances in technology are sophisticated, we cannot ensure that it will always work on either your end or our end. If the connection with a video visit is poor, the visit may have to be switched to a telephone visit. With either a video or telephone visit, we are not always able to ensure that we have a secure connection.  By engaging in this virtual visit, you consent to the provision of healthcare and authorize for your insurance to be billed (if applicable) for the services provided during this visit. Depending on your insurance coverage, you may receive a charge related to this service.  I need to obtain your verbal consent now. Are you willing to proceed with your visit today? Olivia Sutton has provided verbal consent on 03/08/2023 for a virtual visit (video or telephone). Olivia Sutton, [place]  Date: 03/08/2023 9:17 AM  Virtual Visit via Video Note   I, Olivia Sutton, connected with  Olivia Sutton  ([id number], 1986/05/24) on 03/08/23 at  9:00 AM EDT by a video-enabled telemedicine application and verified that I am speaking with the correct person using two identifiers.  Location: Patient: Virtual Visit  Location Patient: Home Provider: Virtual Visit Location Provider: Home Office   I discussed the limitations of evaluation and management by telemedicine and the availability of in person appointments. The patient expressed understanding and agreed to proceed.    History of Present Illness: Olivia Sutton is a 37 y.o. who identifies as a female who was assigned female at birth, and is being seen today for possible conjunctivitis. Endorses symptoms starting around Monday of this week and with start of increased drainage (purulent) and matting since Wednesday. Denies fever, chills, URI symptoms. Denies vision changes.   HPI: HPI  Problems:  Patient Active Problem List   Diagnosis Date Noted   Baker's cyst of knee, right 09/30/2020   Lower extremity tendinopathy 09/30/2020   History of anxiety 05/07/2019   Somatic dysfunction of rib 05/07/2019   Family history of breast cancer 05/07/2019   Hepatitis 12/24/2012   Bradycardia 07/08/2012   Encounter for screening for malignant neoplasm of cervix 07/26/2010   Asymptomatic varicose veins 06/20/2010    Allergies:  Allergies  Allergen Reactions   Sulfamethoxazole-Trimethoprim Rash    LFT elevation, fever Drug-induced hepatitis Elevated liver enzymes, rash, fever    Medications:  Current Outpatient Medications:    azithromycin (AZASITE) 1 % ophthalmic solution, Apply 1 drop to the affected eye(s) twice daily x 1 day.  Then apply daily to affected eye(s) one daily x 4 days., Disp: 2.5 mL, Rfl: 0   acetaminophen (TYLENOL) 500 MG tablet, Take 500 mg by mouth every 6 (six) hours as needed., Disp: ,  Rfl:    ibuprofen (ADVIL) 200 MG tablet, Take 200 mg by mouth every 6 (six) hours as needed., Disp: , Rfl:    sertraline (ZOLOFT) 50 MG tablet, Take 1 tablet (50 mg total) by mouth daily. NEEDS APPOINTMENT FOR FURTHER REFILLS., Disp: 90 tablet, Rfl: 0  Observations/Objective: Patient is well-developed, well-nourished in no acute distress.   Resting comfortably at home.  Head is normocephalic, atraumatic.  No labored breathing. Speech is clear and coherent with logical content.  Patient is alert and oriented at baseline.  Bilateral conjunctival injection noted with some drainage. Pupils are equal and round bilaterally. No noted lid swelling.   Assessment and Plan: 1. Acute bacterial conjunctivitis of both eyes - azithromycin (AZASITE) 1 % ophthalmic solution; Apply 1 drop to the affected eye(s) twice daily x 1 day.  Then apply daily to affected eye(s) one daily x 4 days.  Dispense: 2.5 mL; Refill: 0  Supportive measures and OTC medications reviewed.  Will start azithromycin OP solution applying 1 drop twice daily on day 1 to both eyes, then 1 drop daily for 4 additional days.  Also sent a note to switch to erythromycin OP appointment if the azithromycin drops are not covered by insurance.  Strict in person follow-up precautions reviewed with patient.  Follow Up Instructions: I discussed the assessment and treatment plan with the patient. The patient was provided an opportunity to ask questions and all were answered. The patient agreed with the plan and demonstrated an understanding of the instructions.  A copy of instructions were sent to the patient via MyChart unless otherwise noted below.   The patient was advised to call back or seek an in-person evaluation if the symptoms worsen or if the condition fails to improve as anticipated.  Time:  I spent 10 minutes with the patient via telehealth technology discussing the above problems/concerns.    Olivia Climes, PA-C

## 2023-03-13 ENCOUNTER — Encounter: Payer: Self-pay | Admitting: Medical-Surgical

## 2023-03-13 ENCOUNTER — Other Ambulatory Visit: Payer: Self-pay

## 2023-03-13 ENCOUNTER — Other Ambulatory Visit: Payer: Self-pay | Admitting: Medical-Surgical

## 2023-03-13 DIAGNOSIS — F411 Generalized anxiety disorder: Secondary | ICD-10-CM | POA: Diagnosis not present

## 2023-03-13 MED ORDER — OFLOXACIN 0.3 % OP SOLN
1.0000 [drp] | Freq: Four times a day (QID) | OPHTHALMIC | 0 refills | Status: AC
  Filled 2023-03-13: qty 5, 13d supply, fill #0

## 2023-04-16 DIAGNOSIS — F411 Generalized anxiety disorder: Secondary | ICD-10-CM | POA: Diagnosis not present

## 2023-05-01 ENCOUNTER — Other Ambulatory Visit: Payer: Self-pay | Admitting: Medical-Surgical

## 2023-05-01 ENCOUNTER — Other Ambulatory Visit (HOSPITAL_COMMUNITY): Payer: Self-pay

## 2023-05-01 MED ORDER — SERTRALINE HCL 50 MG PO TABS
50.0000 mg | ORAL_TABLET | Freq: Every day | ORAL | 0 refills | Status: DC
  Filled 2023-05-01: qty 30, 30d supply, fill #0

## 2023-05-04 ENCOUNTER — Ambulatory Visit: Payer: 59 | Admitting: Medical-Surgical

## 2023-05-04 ENCOUNTER — Other Ambulatory Visit: Payer: Self-pay

## 2023-05-04 ENCOUNTER — Ambulatory Visit (INDEPENDENT_AMBULATORY_CARE_PROVIDER_SITE_OTHER): Payer: 59

## 2023-05-04 ENCOUNTER — Other Ambulatory Visit (HOSPITAL_COMMUNITY): Payer: Self-pay

## 2023-05-04 ENCOUNTER — Encounter: Payer: Self-pay | Admitting: Medical-Surgical

## 2023-05-04 VITALS — BP 93/57 | HR 54 | Resp 20 | Ht 64.5 in | Wt 158.3 lb

## 2023-05-04 DIAGNOSIS — M5412 Radiculopathy, cervical region: Secondary | ICD-10-CM

## 2023-05-04 DIAGNOSIS — Z8659 Personal history of other mental and behavioral disorders: Secondary | ICD-10-CM

## 2023-05-04 DIAGNOSIS — M545 Low back pain, unspecified: Secondary | ICD-10-CM | POA: Diagnosis not present

## 2023-05-04 DIAGNOSIS — M542 Cervicalgia: Secondary | ICD-10-CM | POA: Diagnosis not present

## 2023-05-04 MED ORDER — METHYLPREDNISOLONE 4 MG PO TBPK
ORAL_TABLET | ORAL | 0 refills | Status: DC
  Filled 2023-05-04: qty 21, 6d supply, fill #0

## 2023-05-04 MED ORDER — SERTRALINE HCL 50 MG PO TABS
50.0000 mg | ORAL_TABLET | Freq: Every day | ORAL | 3 refills | Status: DC
  Filled 2023-05-04 – 2023-06-12 (×2): qty 90, 90d supply, fill #0
  Filled 2023-10-26 – 2023-11-06 (×2): qty 90, 90d supply, fill #1
  Filled 2024-02-03: qty 90, 90d supply, fill #2

## 2023-05-04 NOTE — Progress Notes (Signed)
        Established patient visit  History, exam, impression, and plan:  1. Acute left-sided low back pain without sciatica Pleasant 37 year old female presenting today with reports of approximately 2 weeks of low back pain radiating to the left hip.  No recent injury or falls.  Has been exercising but no recent activities that would lead to injury.  Does have two 59-year-old at home that she reports like to use her as a jungle gym.  No weakness/numbness/tingling of the lower extremities.  No loss of urine/stool control.  Worse with lifting, lying on the left side, and sitting in her car.  Better with rest and heat.  Has not tried ice.  Took meloxicam as well as tried ibuprofen 200 mg but this was not helpful.  Full range of motion to the lumbar spine.  No midline tenderness.  Discomfort along the left paraspinal muscles and the SI joint.  Getting lumbar spine x-rays today.  Referring to physical therapy.  Treating with Medrol Dosepak. - Ambulatory referral to Physical Therapy - DG Lumbar Spine Complete; Future - methylPREDNISolone (MEDROL DOSEPAK) 4 MG TBPK tablet; 6-day pack as directed  Dispense: 21 tablet; Refill: 0  2. Cervical radiculopathy Has also had a couple of weeks of left-sided neck pain along the paraspinal muscles and down into the trapezius.  Again, no injuries, falls, or concerning activities.  Level of discomfort depends on head position.  Spends a lot of time on a computer during the day and admits her posture is not great.  No midline tenderness.  Full range of motion in all planes however there is tightness and pain with full flexion as well as rightward rotation.  Getting cervical spine x-rays today.  Treating with oral steroids.  After completing steroids, okay to switch to meloxicam or ibuprofen if desired.  Referring to physical therapy. - DG Cervical Spine Complete; Future - methylPREDNISolone (MEDROL DOSEPAK) 4 MG TBPK tablet; 6-day pack as directed  Dispense: 21 tablet;  Refill: 0  3. History of anxiety She does have a history of anxiety which is currently very well-managed with Zoloft 50 mg daily.  Interested in having GeneSight testing performed.  Discussed the process and potential for insurance coverage.  Ordering GeneSight today. - sertraline (ZOLOFT) 50 MG tablet; Take 1 tablet (50 mg total) by mouth daily.  Dispense: 90 tablet; Refill: 3  Procedures performed this visit: None.  Return for annual physical exam at your convenience.  __________________________________ Thayer Ohm, DNP, APRN, FNP-BC Primary Care and Sports Medicine [facility]

## 2023-05-14 ENCOUNTER — Ambulatory Visit: Payer: 59 | Admitting: Rehabilitative and Restorative Service Providers"

## 2023-05-14 NOTE — Therapy (Deleted)
OUTPATIENT PHYSICAL THERAPY THORACOLUMBAR EVALUATION   Patient Name: Olivia Sutton MRN: [id number] DOB:Dec 03, 1985, 37 y.o., female Today's Date: 05/14/2023  END OF SESSION:   Past Medical History:  Diagnosis Date   Family history of breast cancer 05/07/2019   Multiple relatives on maternal side, BRCA negative, pt's sister (-)BRCA    History of anxiety 05/07/2019   Somatic dysfunction of rib 05/07/2019   No past surgical history on file. Patient Active Problem List   Diagnosis Date Noted   Baker's cyst of knee, right 09/30/2020   Lower extremity tendinopathy 09/30/2020   History of anxiety 05/07/2019   Somatic dysfunction of rib 05/07/2019   Family history of breast cancer 05/07/2019   Hepatitis 12/24/2012   Bradycardia 07/08/2012   Encounter for screening for malignant neoplasm of cervix 07/26/2010   Asymptomatic varicose veins 06/20/2010    PCP: Christen Butter, NP REFERRING PROVIDER: Christen Butter, NP REFERRING DIAG: M54.50 (ICD-10-CM) - Acute left-sided low back pain without sciatica   Rationale for Evaluation and Treatment: Rehabilitation  THERAPY DIAG:  No diagnosis found.  ONSET DATE: 05/04/23  SUBJECTIVE:                                                                                                                                                                                           SUBJECTIVE STATEMENT: ***  PERTINENT HISTORY:  ***  PAIN:  Are you having pain? Yes: NPRS scale: ***/10 Pain location: *** Pain description: *** Aggravating factors: *** Relieving factors: ***  PRECAUTIONS: {Therapy precautions:24002}  WEIGHT BEARING RESTRICTIONS: {Yes ***/No:24003}  FALLS:  Has patient fallen in last 6 months? {fallsyesno:27318}  LIVING ENVIRONMENT: Lives with: {OPRC lives with:25569::"lives with their family"} Lives in: {Lives in:25570} Stairs: {opstairs:27293} Has following equipment at home: {Assistive devices:23999}  OCCUPATION: ***  PLOF:  {PLOF:24004}  PATIENT GOALS: ***  NEXT MD VISIT: ***  OBJECTIVE:   DIAGNOSTIC FINDINGS:  ***  PATIENT SURVEYS:  {rehab surveys:24030}  SCREENING FOR RED FLAGS: Bowel or bladder incontinence: {Yes/No:304960894} Spinal tumors: {Yes/No:304960894} Cauda equina syndrome: {Yes/No:304960894} Compression fracture: {Yes/No:304960894} Abdominal aneurysm: {Yes/No:304960894}  COGNITION: Overall cognitive status: {cognition:24006}     SENSATION: {sensation:27233}  MUSCLE LENGTH: Hamstrings: Right *** deg; Left *** deg Thomas test: Right *** deg; Left *** deg  POSTURE: {posture:25561}  PALPATION: ***  LUMBAR ROM:   AROM eval  Flexion   Extension   Right lateral flexion   Left lateral flexion   Right rotation   Left rotation    (Blank rows = not tested)  LOWER EXTREMITY ROM:     {AROM/PROM:27142}  Right eval Left eval  Hip flexion    Hip extension  Hip abduction    Hip adduction    Hip internal rotation    Hip external rotation    Knee flexion    Knee extension    Ankle dorsiflexion    Ankle plantarflexion    Ankle inversion    Ankle eversion     (Blank rows = not tested)  LOWER EXTREMITY MMT:    MMT Right eval Left eval  Hip flexion    Hip extension    Hip abduction    Hip adduction    Hip internal rotation    Hip external rotation    Knee flexion    Knee extension    Ankle dorsiflexion    Ankle plantarflexion    Ankle inversion    Ankle eversion     (Blank rows = not tested)  LUMBAR SPECIAL TESTS:  {lumbar special test:25242}  FUNCTIONAL TESTS:  {Functional tests:24029}  GAIT: Distance walked: *** Assistive device utilized: {Assistive devices:23999} Level of assistance: {Levels of assistance:24026} Comments: ***  TODAY'S TREATMENT:                                                                                                                              DATE: ***    PATIENT EDUCATION:  Education details: *** Person educated:  {Person educated:25204} Education method: {Education Method:25205} Education comprehension: {Education Comprehension:25206}  HOME EXERCISE PROGRAM: ***  ASSESSMENT:  CLINICAL IMPRESSION: Patient is a *** y.o. *** who was seen today for physical therapy evaluation and treatment for ***.   OBJECTIVE IMPAIRMENTS: {opptimpairments:25111}.   ACTIVITY LIMITATIONS: {activitylimitations:27494}  PARTICIPATION LIMITATIONS: {participationrestrictions:25113}  PERSONAL FACTORS: {Personal factors:25162} are also affecting patient's functional outcome.   REHAB POTENTIAL: {rehabpotential:25112}  CLINICAL DECISION MAKING: {clinical decision making:25114}  EVALUATION COMPLEXITY: {Evaluation complexity:25115}   GOALS: Goals reviewed with patient? {yes/no:20286}  SHORT TERM GOALS: Target date: ***  *** Baseline: Goal status: {GOALSTATUS:25110}  2.  *** Baseline:  Goal status: {GOALSTATUS:25110}  3.  *** Baseline:  Goal status: {GOALSTATUS:25110}  4.  *** Baseline:  Goal status: {GOALSTATUS:25110}  5.  *** Baseline:  Goal status: {GOALSTATUS:25110}  6.  *** Baseline:  Goal status: {GOALSTATUS:25110}  LONG TERM GOALS: Target date: ***  *** Baseline:  Goal status: {GOALSTATUS:25110}  2.  *** Baseline:  Goal status: {GOALSTATUS:25110}  3.  *** Baseline:  Goal status: {GOALSTATUS:25110}  4.  *** Baseline:  Goal status: {GOALSTATUS:25110}  5.  *** Baseline:  Goal status: {GOALSTATUS:25110}  6.  *** Baseline:  Goal status: {GOALSTATUS:25110}  PLAN:  PT FREQUENCY: {rehab frequency:25116}  PT DURATION: {rehab duration:25117}  PLANNED INTERVENTIONS: {rehab planned interventions:25118::"Therapeutic exercises","Therapeutic activity","Neuromuscular re-education","Balance training","Gait training","Patient/Family education","Self Care","Joint mobilization"}.  PLAN FOR NEXT SESSION: ***   Amalya Salmons, PT 05/14/2023, 10:30 AM

## 2023-05-15 DIAGNOSIS — F411 Generalized anxiety disorder: Secondary | ICD-10-CM | POA: Diagnosis not present

## 2023-05-16 ENCOUNTER — Ambulatory Visit: Payer: 59 | Attending: Medical-Surgical | Admitting: Physical Therapy

## 2023-05-16 ENCOUNTER — Encounter: Payer: Self-pay | Admitting: Physical Therapy

## 2023-05-16 ENCOUNTER — Other Ambulatory Visit: Payer: Self-pay

## 2023-05-16 DIAGNOSIS — F411 Generalized anxiety disorder: Secondary | ICD-10-CM | POA: Diagnosis not present

## 2023-05-16 DIAGNOSIS — R2689 Other abnormalities of gait and mobility: Secondary | ICD-10-CM | POA: Diagnosis not present

## 2023-05-16 DIAGNOSIS — M6281 Muscle weakness (generalized): Secondary | ICD-10-CM | POA: Insufficient documentation

## 2023-05-16 DIAGNOSIS — R293 Abnormal posture: Secondary | ICD-10-CM | POA: Diagnosis not present

## 2023-05-16 DIAGNOSIS — M545 Low back pain, unspecified: Secondary | ICD-10-CM | POA: Insufficient documentation

## 2023-05-16 NOTE — Therapy (Signed)
OUTPATIENT PHYSICAL THERAPY THORACOLUMBAR EVALUATION   Patient Name: Olivia Sutton MRN: [id number] DOB:11/06/1986, 37 y.o., female Today's Date: 05/16/2023  END OF SESSION:  PT End of Session - 05/16/23 1316     Visit Number 1    Date for PT Re-Evaluation 07/11/23    Authorization Type [facility]    PT Start Time 1316    PT Stop Time 1400    PT Time Calculation (min) 44 min    Activity Tolerance Patient tolerated treatment well             Past Medical History:  Diagnosis Date   Family history of breast cancer 05/07/2019   Multiple relatives on maternal side, BRCA negative, pt's sister (-)BRCA    History of anxiety 05/07/2019   Somatic dysfunction of rib 05/07/2019   History reviewed. No pertinent surgical history. Patient Active Problem List   Diagnosis Date Noted   Baker's cyst of knee, right 09/30/2020   Lower extremity tendinopathy 09/30/2020   History of anxiety 05/07/2019   Somatic dysfunction of rib 05/07/2019   Family history of breast cancer 05/07/2019   Hepatitis 12/24/2012   Bradycardia 07/08/2012   Encounter for screening for malignant neoplasm of cervix 07/26/2010   Asymptomatic varicose veins 06/20/2010    PCP: Christen Butter  REFERRING PROVIDER: Christen Butter, NP  REFERRING DIAG: M54.50 (ICD-10-CM) - Acute left-sided low back pain without sciatica   Rationale for Evaluation and Treatment: Rehabilitation  THERAPY DIAG:  Muscle weakness (generalized)  Abnormal posture  Other abnormalities of gait and mobility  ONSET DATE: ~3 weeks ago  SUBJECTIVE:                                                                                                                                                                                           SUBJECTIVE STATEMENT: Pt notes L hip and back pain with L neck pain. No method of injury, slow onset. She started medrol pack and stretching at home. Pt states L hip pain persists but thinks that stretching may have  helped. She has changed her positioning with sleep recently - now laying supine which she feels has also helped. Has been using heat. Has 2 five year olds that she does lift.   PERTINENT HISTORY:  No PMH  PAIN:  Are you having pain? Yes: NPRS scale: 0 currently, at worst 5/10 Pain location: L Back/hip Pain description: sharp in the back; hip pain can ache for a while. Radiates from left low back to lateral L hip Aggravating factors: Prolonged sitting, bending Relieving factors: in the pool  PRECAUTIONS: None  WEIGHT BEARING RESTRICTIONS: No  FALLS:  Has patient fallen in last 6 months? 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 Minimal degenerative disc disease at L4-5. 2. Moderate to severe fecal loading in the colon.  Cervical x-ray 05/04/23 IMPRESSION: Negative cervical spine radiographs.  PATIENT SURVEYS:  FOTO 57; predicted 72  SCREENING FOR RED FLAGS: Bowel or bladder incontinence: No Spinal tumors: No Cauda equina syndrome: No Compression fracture: No Abdominal aneurysm: No  COGNITION: Overall cognitive status: Within functional limits for tasks assessed     SENSATION: WFL  MUSCLE LENGTH: Hamstrings: Right >90 deg; Left >90 deg Maisie Fus test: Right 10 deg; Left 10 deg  POSTURE: increased lumbar lordosis, posterior pelvic tilt, and weight shift right  PALPATION: Mild TTP to L glute med, glute max and L lumbar paraspinal  LUMBAR ROM:   AROM eval  Flexion 100%  Extension 75%*  Right lateral flexion 100%   Left lateral flexion 100%  Right rotation 100%  Left rotation 100%   (Blank rows = not tested) * concordant pain  LOWER EXTREMITY ROM:     Active  Right eval Left eval  Hip flexion    Hip extension    Hip abduction    Hip adduction    Hip internal rotation    Hip external rotation    Knee flexion    Knee extension    Ankle dorsiflexion    Ankle plantarflexion    Ankle inversion    Ankle eversion     (Blank rows = not tested)  LOWER EXTREMITY MMT:    MMT Right eval Left eval  Hip flexion 4 4  Hip extension 5 3+  Hip abduction 4+ 3+  Hip adduction    Hip internal rotation 5 5  Hip external rotation 5 4  Knee flexion 5 5  Knee extension 5 5  Ankle dorsiflexion    Ankle plantarflexion    Ankle inversion    Ankle eversion     (Blank rows = not tested)  LUMBAR SPECIAL TESTS:  Straight leg raise test: Negative, Single leg stance test: Positive, FABER test: Negative, Long sit test: Negative, and Thomas test: Negative  FUNCTIONAL TESTS:  Front plank: 40 sec Side plank: 23 sec on L R single leg stance: >1 min; L single leg stance: >1 min but irritates L low back/hip  GAIT: Distance walked: 100' Assistive device utilized: None Level of assistance: Complete Independence Comments: WNL  TODAY'S TREATMENT:  DATE: 05/16/23  See HEP below   PATIENT EDUCATION:  Education details: Exam findings, POC, initial HEP Person educated: Patient Education method: Explanation, Demonstration, and Handouts Education comprehension: verbalized understanding, returned demonstration, and needs further education  HOME EXERCISE PROGRAM: Access Code: [email or web address] URL: [email or web address] Date: 05/16/2023 Prepared by: Vernon Prey April Kirstie Peri  Exercises - Bird Dog  - 1 x daily - 7 x weekly - 2 sets - 10 reps - 3 sec hold - Quadruped Fire  Hydrant  - 1 x daily - 7 x weekly - 2 sets - 10 reps - 3 sec hold - Standing Anti-Rotation Press with Anchored Resistance  - 1 x daily - 7 x weekly - 2 sets - 10 reps - 3 sec hold  ASSESSMENT:  CLINICAL IMPRESSION: Patient is a 37 y.o. F who was seen today for physical therapy evaluation and treatment for L side low back and hip pain. Assessment most significant for posterior pelvic tilt with L glute and core weakness. Mild muscle tightness in lumbar paraspinal and glute. No overt pelvic asymmetries noted today with special testing. Pt will benefit from PT to address her issues for improved comfort with all sitting and bending tasks.   OBJECTIVE IMPAIRMENTS: decreased activity tolerance, decreased endurance, decreased mobility, decreased ROM, decreased strength, increased fascial restrictions, improper body mechanics, postural dysfunction, and pain.   ACTIVITY LIMITATIONS: carrying, lifting, bending, sitting, sleeping, and stairs  PARTICIPATION LIMITATIONS: driving, community activity, and occupation  PERSONAL FACTORS: Fitness, Past/current experiences, Profession, and Time since onset of injury/illness/exacerbation are also affecting patient's functional outcome.   REHAB POTENTIAL: Good  CLINICAL DECISION MAKING: Stable/uncomplicated  EVALUATION COMPLEXITY: Low   GOALS: Goals reviewed with patient? Yes  SHORT TERM GOALS: Target date: 06/13/2023   Pt will be ind with initial HEP Baseline: Goal status: INITIAL  2.  Pt will be able to perform single leg stance without any pain/discomfort Baseline:  Goal status: INITIAL  3.  Pt will have full and pain free lumbar ROM Baseline:  Goal status: INITIAL   LONG TERM GOALS: Target date: 07/11/2023   Pt will be ind with management and progression of HEP to transition to community wellness Baseline:  Goal status: INITIAL  2.  Pt will be able to perform plank for at least 1 min to demo increased core strength Baseline:  Goal  status: INITIAL  3.  Pt will be able to sit for at least 30 min without pain for her work tasks Baseline:  Goal status: INITIAL  4.  Pt will have improved FOTO score to 72 Baseline:  Goal status: INITIAL   PLAN:  PT FREQUENCY: 1x/week  PT DURATION: 8 weeks  PLANNED INTERVENTIONS: Therapeutic exercises, Therapeutic activity, Neuromuscular re-education, Balance training, Gait training, Patient/Family education, Self Care, Joint mobilization, Aquatic Therapy, Dry Needling, Electrical stimulation, Spinal mobilization, Cryotherapy, Moist heat, Taping, Ionotophoresis 4mg /ml Dexamethasone, Manual therapy, and Re-evaluation.  PLAN FOR NEXT SESSION: How was HEP? Work on hip and core strengthening. Initiate seated core exercises and postural stability.    Neven Fina April Ma L Lary Eckardt, PT 05/16/2023, 2:32 PM

## 2023-05-23 ENCOUNTER — Ambulatory Visit: Payer: 59 | Admitting: Physical Therapy

## 2023-05-25 ENCOUNTER — Ambulatory Visit
Admission: EM | Admit: 2023-05-25 | Discharge: 2023-05-25 | Disposition: A | Discharge status: Home / Self Care | Payer: 59 | Attending: Family Medicine | Admitting: Family Medicine

## 2023-05-25 ENCOUNTER — Telehealth: Payer: Self-pay | Admitting: Family Medicine

## 2023-05-25 ENCOUNTER — Encounter: Payer: Self-pay | Admitting: Medical-Surgical

## 2023-05-25 DIAGNOSIS — R509 Fever, unspecified: Secondary | ICD-10-CM | POA: Diagnosis not present

## 2023-05-25 DIAGNOSIS — U071 COVID-19: Secondary | ICD-10-CM | POA: Diagnosis not present

## 2023-05-25 DIAGNOSIS — R059 Cough, unspecified: Secondary | ICD-10-CM

## 2023-05-25 LAB — POCT RAPID STREP A (OFFICE): Rapid Strep A Screen: NEGATIVE

## 2023-05-25 LAB — POC SARS CORONAVIRUS 2 AG -  ED: SARS Coronavirus 2 Ag: POSITIVE — AB

## 2023-05-25 MED ORDER — PAXLOVID (300/100) 20 X 150 MG & 10 X 100MG PO TBPK
3.0000 | ORAL_TABLET | Freq: Two times a day (BID) | ORAL | 0 refills | Status: AC
  Filled 2023-05-25: qty 30, 5d supply, fill #0

## 2023-05-25 MED ORDER — BENZONATATE 200 MG PO CAPS
200.0000 mg | ORAL_CAPSULE | Freq: Three times a day (TID) | ORAL | 0 refills | Status: AC | PRN
  Filled 2023-05-25 – 2023-05-29 (×2): qty 40, 14d supply, fill #0

## 2023-05-25 MED ORDER — PAXLOVID (300/100) 20 X 150 MG & 10 X 100MG PO TBPK: 3.0000 | ORAL_TABLET | Freq: Two times a day (BID) | ORAL | 0 refills | Status: AC

## 2023-05-25 MED ORDER — PROMETHAZINE-DM 6.25-15 MG/5ML PO SYRP
5.0000 mL | ORAL_SOLUTION | Freq: Two times a day (BID) | ORAL | 0 refills | Status: DC | PRN
  Filled 2023-05-25 – 2023-05-29 (×2): qty 118, 12d supply, fill #0

## 2023-05-25 NOTE — ED Provider Notes (Signed)
[facility]    CSN: [id number] Arrival date & time: 05/25/23  1323      History   Chief Complaint Chief Complaint  Patient presents with   Fatigue   Cough   Sore Throat    HPI Olivia Sutton is a 37 y.o. female.   HPI Pleasant 37 year old female presents with cough, sore throat, body aches, and fever for 4 days.  Reports COVID 19 test was negative at home on Tuesday, 05/22/2023.  PMH significant for anxiety and Baker's cyst of right knee.  Past Medical History:  Diagnosis Date   Family history of breast cancer 05/07/2019   Multiple relatives on maternal side, BRCA negative, pt's sister (-)BRCA    History of anxiety 05/07/2019   Somatic dysfunction of rib 05/07/2019    Patient Active Problem List   Diagnosis Date Noted   Baker's cyst of knee, right 09/30/2020   Lower extremity tendinopathy 09/30/2020   History of anxiety 05/07/2019   Somatic dysfunction of rib 05/07/2019   Family history of breast cancer 05/07/2019   Hepatitis 12/24/2012   Bradycardia 07/08/2012   Encounter for screening for malignant neoplasm of cervix 07/26/2010   Asymptomatic varicose veins 06/20/2010    History reviewed. No pertinent surgical history.  OB History     Gravida  0   Para  0   Term  0   Preterm  0   AB  0   Living  0      SAB  0   IAB  0   Ectopic  0   Multiple  0   Live Births  0            Home Medications    Prior to Admission medications   Medication Sig Start Date End Date Taking? Authorizing Provider  acetaminophen (TYLENOL) 500 MG tablet Take 500 mg by mouth every 6 (six) hours as needed.    [provider]  benzonatate (TESSALON) 200 MG capsule Take 1 capsule (200 mg total) by mouth 3 (three) times daily as needed for up to 7 days. 05/25/23 06/01/23 Yes Trevor Iha, FNP  ibuprofen (ADVIL) 200 MG tablet Take 200 mg by mouth every 6 (six) hours as needed.    [provider]  nirmatrelvir & ritonavir (PAXLOVID,  300/100,) 20 x 150 MG & 10 x 100MG  TBPK Take 3 tablets by mouth 2 (two) times daily for 5 days. 05/25/23 05/30/23 Yes Trevor Iha, FNP  promethazine-dextromethorphan (PROMETHAZINE-DM) 6.25-15 MG/5ML syrup Take 5 mLs by mouth 2 (two) times daily as needed for cough. 05/25/23  Yes Trevor Iha, FNP  sertraline (ZOLOFT) 50 MG tablet Take 1 tablet (50 mg total) by mouth daily. 05/04/23   Christen Butter, NP    Family History Family History  Problem Relation Age of Onset   High blood pressure Father    Diabetes Father    Breast cancer Maternal Aunt    Breast cancer Maternal Aunt    Breast cancer Maternal Grandmother    Heart attack Maternal Grandfather    Stroke Paternal Grandfather    Breast cancer Cousin     Social History Social History   Tobacco Use   Smoking status: Never   Smokeless tobacco: Never  Vaping Use   Vaping Use: Never used  Substance Use Topics   Alcohol use: Yes    Alcohol/week: 1.0 standard drink of alcohol    Types: 1 Standard drinks or equivalent per week   Drug use: Never  Allergies   Sulfamethoxazole-trimethoprim   Review of Systems Review of Systems  Constitutional:  Positive for chills and fatigue.  Respiratory:  Positive for cough.   All other systems reviewed and are negative.    Physical Exam Triage Vital Signs ED Triage Vitals  Enc Vitals Group     BP      Pulse      Resp      Temp      Temp src      SpO2      Weight      Height      Head Circumference      Peak Flow      Pain Score      Pain Loc      Pain Edu?      Excl. in GC?    No data found.  Updated Vital Signs BP 112/77 (BP Location: Right Arm)   Pulse 80   Temp 98.5 F (36.9 C) (Oral)   Resp 19   LMP 05/04/2023   SpO2 99%      Physical Exam Vitals and nursing note reviewed.  Constitutional:      Appearance: Normal appearance. She is normal weight. She is not ill-appearing.  HENT:     Head: Normocephalic and atraumatic.     Right Ear: External ear normal.      Left Ear: External ear normal.     Mouth/Throat:     Mouth: Mucous membranes are moist.     Pharynx: Oropharynx is clear.  Eyes:     Extraocular Movements: Extraocular movements intact.     Conjunctiva/sclera: Conjunctivae normal.     Pupils: Pupils are equal, round, and reactive to light.  Cardiovascular:     Rate and Rhythm: Normal rate and regular rhythm.     Pulses: Normal pulses.     Heart sounds: Normal heart sounds.  Pulmonary:     Effort: Pulmonary effort is normal.     Breath sounds: Normal breath sounds. No wheezing, rhonchi or rales.  Musculoskeletal:        General: Normal range of motion.     Cervical back: Normal range of motion and neck supple.  Skin:    General: Skin is warm and dry.  Neurological:     General: No focal deficit present.     Mental Status: She is alert and oriented to person, place, and time. Mental status is at baseline.  Psychiatric:        Mood and Affect: Mood normal.        Behavior: Behavior normal.        Thought Content: Thought content normal.      UC Treatments / Results  Labs (all labs ordered are listed, but only abnormal results are displayed) Labs Reviewed  POC SARS CORONAVIRUS 2 AG -  ED - Abnormal; Notable for the following components:      Result Value   SARS Coronavirus 2 Ag Positive (*)    All other components within normal limits  POCT RAPID STREP A (OFFICE)    EKG   Radiology No results found.  Procedures Procedures (including critical care time)  Medications Ordered in UC Medications - No data to display  Initial Impression / Assessment and Plan / UC Course  I have reviewed the triage vital signs and the nursing notes.  Pertinent labs & imaging results that were available during my care of the patient were reviewed by me and considered in my medical decision making (  see chart for details).     MDM: 1.  COVID-19-Rx'd Paxlovid (300/100) 20 x 150 mg & 10 x 100 mg TBPK: Take 3 tablets twice daily for 5  days; 2.  Fever, unspecified type-Advised patient may take OTC Tylenol 1 g every 6 hours for fever (oral temperature greater than 100.3).  3.  Cough-Rx'd Tessalon Perles 200 mg 3 times daily, as needed, Promethazine DM 6.25-15 mg / 5 mL syrup: Take 5 mL by mouth twice daily as needed for cough. Advised patient to take medication as directed with food to completion.  Advised patient may take OTC Tylenol 1 g every 6 hours for fever (oral temperature greater than 100.3).  Advised may take Tessalon Perles daily or as needed for cough.  Advised may use Promethazine DM at night for cough prior to sleep due to sedative effects.  Advised patient not to use cough medications together.  Encouraged patient to increase daily water intake 64 ounces per day while taking these medications. Advised patient if symptoms worsen and/or unresolved please follow-up with PCP or here for further evaluation.  Work note provided to patient prior to discharge today.  Patient discharged home, hemodynamically stable.  Final Clinical Impressions(s) / UC Diagnoses   Final diagnoses:  COVID-19  Fever, unspecified  Cough, unspecified type     Discharge Instructions      Advised patient to take medication as directed with food to completion.  Advised patient may take OTC Tylenol 1 g every 6 hours for fever (oral temperature greater than 100.3).  Advised may take Tessalon Perles daily or as needed for cough.  Advised may use Promethazine DM at night for cough prior to sleep due to sedative effects.  Advised patient not to use cough medications together.  Encouraged patient to increase daily water intake 64 ounces per day while taking these medications.  Advised patient if symptoms worsen and/or unresolved please follow-up with PCP or here for further evaluation.     ED Prescriptions     Medication Sig Dispense Auth. Provider   nirmatrelvir & ritonavir (PAXLOVID, 300/100,) 20 x 150 MG & 10 x 100MG  TBPK Take 3 tablets by mouth 2  (two) times daily for 5 days. 30 tablet Trevor Iha, FNP   benzonatate (TESSALON) 200 MG capsule Take 1 capsule (200 mg total) by mouth 3 (three) times daily as needed for up to 7 days. 40 capsule Trevor Iha, FNP   promethazine-dextromethorphan (PROMETHAZINE-DM) 6.25-15 MG/5ML syrup Take 5 mLs by mouth 2 (two) times daily as needed for cough. 118 mL Trevor Iha, FNP      PDMP not reviewed this encounter.   Trevor Iha, FNP 05/25/23 1426

## 2023-05-25 NOTE — Telephone Encounter (Signed)
She reports unable to get Paxlovid from [facility] health pharmacy and requested [facility].  Advised patient Paxlovid sent to [facility] per her request.

## 2023-05-25 NOTE — ED Triage Notes (Signed)
Pt c/o cough, sore throat, bodyaches, fever x 4 days. COVID neg at home on Tues evening. OTC cough and cold and tylenol prn.

## 2023-05-25 NOTE — Discharge Instructions (Addendum)
Advised patient to take medication as directed with food to completion.  Advised patient may take OTC Tylenol 1 g every 6 hours for fever (oral temperature greater than 100.3).  Advised may take Tessalon Perles daily or as needed for cough.  Advised may use Promethazine DM at night for cough prior to sleep due to sedative effects.  Advised patient not to use cough medications together.  Encouraged patient to increase daily water intake 64 ounces per day while taking these medications.  Advised patient if symptoms worsen and/or unresolved please follow-up with PCP or here for further evaluation.

## 2023-05-26 ENCOUNTER — Other Ambulatory Visit (HOSPITAL_COMMUNITY): Payer: Self-pay

## 2023-05-28 ENCOUNTER — Other Ambulatory Visit: Payer: Self-pay

## 2023-05-29 ENCOUNTER — Other Ambulatory Visit (HOSPITAL_COMMUNITY): Payer: Self-pay

## 2023-05-29 ENCOUNTER — Other Ambulatory Visit: Payer: Self-pay

## 2023-05-30 ENCOUNTER — Other Ambulatory Visit: Payer: Self-pay

## 2023-06-05 ENCOUNTER — Ambulatory Visit: Payer: 59 | Admitting: Medical-Surgical

## 2023-06-06 ENCOUNTER — Ambulatory Visit: Payer: 59 | Admitting: Physical Therapy

## 2023-06-12 ENCOUNTER — Other Ambulatory Visit: Payer: Self-pay

## 2023-06-13 ENCOUNTER — Ambulatory Visit: Payer: 59 | Admitting: Physical Therapy

## 2023-06-18 ENCOUNTER — Other Ambulatory Visit: Payer: Self-pay | Admitting: Oncology

## 2023-06-18 DIAGNOSIS — Z006 Encounter for examination for normal comparison and control in clinical research program: Secondary | ICD-10-CM

## 2023-06-19 DIAGNOSIS — F411 Generalized anxiety disorder: Secondary | ICD-10-CM | POA: Diagnosis not present

## 2023-06-21 ENCOUNTER — Other Ambulatory Visit (HOSPITAL_COMMUNITY): Payer: Self-pay

## 2023-06-21 MED ORDER — PENICILLIN V POTASSIUM 500 MG PO TABS
500.0000 mg | ORAL_TABLET | Freq: Four times a day (QID) | ORAL | 0 refills | Status: DC
Start: 2023-06-21 — End: 2023-12-19
  Filled 2023-06-21: qty 28, 7d supply, fill #0

## 2023-06-21 MED ORDER — HYDROCODONE-ACETAMINOPHEN 5-325 MG PO TABS
1.0000 | ORAL_TABLET | ORAL | 0 refills | Status: DC | PRN
Start: 2023-06-21 — End: 2023-12-19
  Filled 2023-06-21: qty 15, 3d supply, fill #0

## 2023-06-29 ENCOUNTER — Other Ambulatory Visit (HOSPITAL_COMMUNITY): Payer: Self-pay

## 2023-07-02 ENCOUNTER — Other Ambulatory Visit (HOSPITAL_COMMUNITY): Payer: Self-pay

## 2023-07-02 ENCOUNTER — Other Ambulatory Visit: Payer: Self-pay

## 2023-07-03 ENCOUNTER — Other Ambulatory Visit (HOSPITAL_COMMUNITY): Payer: Self-pay

## 2023-07-18 ENCOUNTER — Encounter: Payer: 59 | Admitting: Medical-Surgical

## 2023-07-19 DIAGNOSIS — F411 Generalized anxiety disorder: Secondary | ICD-10-CM | POA: Diagnosis not present

## 2023-07-20 ENCOUNTER — Ambulatory Visit (INDEPENDENT_AMBULATORY_CARE_PROVIDER_SITE_OTHER): Payer: 59 | Admitting: Medical-Surgical

## 2023-07-20 ENCOUNTER — Other Ambulatory Visit (HOSPITAL_COMMUNITY): Payer: Self-pay

## 2023-07-20 ENCOUNTER — Encounter: Payer: Self-pay | Admitting: Medical-Surgical

## 2023-07-20 VITALS — BP 101/68 | HR 63 | Resp 20 | Ht 64.5 in | Wt 164.0 lb

## 2023-07-20 DIAGNOSIS — Z7689 Persons encountering health services in other specified circumstances: Secondary | ICD-10-CM | POA: Diagnosis not present

## 2023-07-20 DIAGNOSIS — Z0000 Encounter for general adult medical examination without abnormal findings: Secondary | ICD-10-CM | POA: Diagnosis not present

## 2023-07-20 DIAGNOSIS — M542 Cervicalgia: Secondary | ICD-10-CM

## 2023-07-20 MED ORDER — NALTREXONE-BUPROPION HCL ER 8-90 MG PO TB12
1.0000 | ORAL_TABLET | Freq: Two times a day (BID) | ORAL | 0 refills | Status: DC
Start: 2023-07-20 — End: 2023-12-19
  Filled 2023-07-20: qty 120, 40d supply, fill #0
  Filled 2023-07-24 – 2023-08-17 (×4): qty 120, 30d supply, fill #0

## 2023-07-20 NOTE — Progress Notes (Unsigned)
Complete physical exam  Patient: Olivia Sutton   DOB: 05/24/1986   37 y.o. Female  MRN: [id number]  Subjective:    Chief Complaint  Patient presents with  . Annual Exam    Pt states she is still having neck pain from her last visit   Keola Rabanales is a 37 y.o. female who presents today for a complete physical exam. She reports consuming a general diet.  Regular exercise and likes to swim.  She generally feels well. She reports sleeping well. She does not have additional problems to discuss today.    Most recent fall risk assessment:    05/04/2023    9:39 AM  Fall Risk   Falls in the past year? 0  Number falls in past yr: 0  Injury with Fall? 0  Risk for fall due to : No Fall Risks  Follow up Falls evaluation completed     Most recent depression screenings:    05/04/2023    9:40 AM 07/27/2022   10:51 AM  PHQ 2/9 Scores  PHQ - 2 Score 0 0    Vision:Within last year, Dental: No current dental problems and Receives regular dental care, and STD: The patient denies history of sexually transmitted disease.  {History (Optional):23778}  Patient Care Team: Christen Butter, NP as PCP - General (Nurse Practitioner)   Outpatient Medications Prior to Visit  Medication Sig  . acetaminophen (TYLENOL) 500 MG tablet Take 500 mg by mouth every 6 (six) hours as needed.  Marland Kitchen ibuprofen (ADVIL) 200 MG tablet Take 200 mg by mouth every 6 (six) hours as needed.  . sertraline (ZOLOFT) 50 MG tablet Take 1 tablet (50 mg total) by mouth daily.  . [DISCONTINUED] promethazine-dextromethorphan (PROMETHAZINE-DM) 6.25-15 MG/5ML syrup Take 5 mLs by mouth 2 times daily as needed for cough.   No facility-administered medications prior to visit.    Review of Systems  Constitutional:  Negative for chills, fever, malaise/fatigue and weight loss.  HENT:  Negative for congestion, ear pain, hearing loss, sinus pain and sore throat.   Eyes:  Negative for blurred vision, photophobia and pain.  Respiratory:   Negative for cough, shortness of breath and wheezing.   Cardiovascular:  Negative for chest pain, palpitations and leg swelling.  Gastrointestinal:  Negative for abdominal pain, constipation, diarrhea, heartburn, nausea and vomiting.  Genitourinary:  Negative for dysuria, frequency and urgency.  Musculoskeletal:  Negative for falls and neck pain.  Skin:  Negative for itching and rash.  Neurological:  Negative for dizziness, weakness and headaches.  Endo/Heme/Allergies:  Negative for polydipsia. Does not bruise/bleed easily.  Psychiatric/Behavioral:  Negative for depression, substance abuse and suicidal ideas. The patient is not nervous/anxious and does not have insomnia.      Objective:    BP 101/68 (BP Location: Left Arm, Patient Position: Sitting, Cuff Size: Normal)   Pulse 63   Resp 20   Ht 5' 4.5" (1.638 m)   Wt 164 lb (74.4 kg)   SpO2 98%   BMI 27.72 kg/m  {Vitals History (Optional):23777}  Physical Exam Constitutional:      General: She is not in acute distress.    Appearance: Normal appearance. She is not ill-appearing.  HENT:     Head: Normocephalic and atraumatic.     Right Ear: Tympanic membrane, ear canal and external ear normal. There is no impacted cerumen.     Left Ear: Tympanic membrane, ear canal and external ear normal. There is no impacted cerumen.     Nose:  Nose normal. No congestion or rhinorrhea.     Mouth/Throat:     Mouth: Mucous membranes are moist.     Pharynx: No oropharyngeal exudate or posterior oropharyngeal erythema.  Eyes:     General: No scleral icterus.       Right eye: No discharge.        Left eye: No discharge.     Extraocular Movements: Extraocular movements intact.     Conjunctiva/sclera: Conjunctivae normal.     Pupils: Pupils are equal, round, and reactive to light.  Neck:     Thyroid: No thyromegaly.     Vascular: No carotid bruit or JVD.     Trachea: Trachea normal.  Cardiovascular:     Rate and Rhythm: Normal rate and regular  rhythm.     Pulses: Normal pulses.     Heart sounds: Normal heart sounds. No murmur heard.    No friction rub. No gallop.  Pulmonary:     Effort: Pulmonary effort is normal. No respiratory distress.     Breath sounds: Normal breath sounds. No wheezing.  Abdominal:     General: Bowel sounds are normal. There is no distension.     Palpations: Abdomen is soft.     Tenderness: There is no abdominal tenderness. There is no guarding.  Musculoskeletal:        General: Normal range of motion.     Cervical back: Normal range of motion and neck supple.  Lymphadenopathy:     Cervical: No cervical adenopathy.  Skin:    General: Skin is warm and dry.  Neurological:     Mental Status: She is alert and oriented to person, place, and time.     Cranial Nerves: No cranial nerve deficit.  Psychiatric:        Mood and Affect: Mood normal.        Behavior: Behavior normal.        Thought Content: Thought content normal.        Judgment: Judgment normal.     No results found for any visits on 07/20/23. {Show previous labs (optional):23779}    Assessment & Plan:    Routine Health Maintenance and Physical Exam  Immunization History  Administered Date(s) Administered  . Influenza,inj,Quad PF,6+ Mos 09/08/2018, 01/21/2019, 08/20/2019  . Influenza-Unspecified 10/08/2009, 08/31/2015, 08/20/2020, 08/27/2021  . PFIZER(Purple Top)SARS-COV-2 Vaccination 11/10/2019, 11/24/2019, 09/03/2020  . PPD Test 03/24/2014, 03/24/2015, 03/21/2016  . Tdap 06/20/2010, 02/26/2018, 01/16/2019    Health Maintenance  Topic Date Due  . Hepatitis C Screening  Never done  . COVID-19 Vaccine (4 - 2023-24 season) 07/28/2022  . INFLUENZA VACCINE  06/28/2023  . PAP SMEAR-Modifier  04/13/2025  . DTaP/Tdap/Td (4 - Td or Tdap) 01/16/2029  . HIV Screening  Completed  . HPV VACCINES  Aged Out    Discussed health benefits of physical activity, and encouraged her to engage in regular exercise appropriate for her age and  condition.  Problem List Items Addressed This Visit   None Visit Diagnoses     Annual physical exam    -  Primary      Return in about 6 months (around 01/20/2024) for mood follow up.     Christen Butter, NP

## 2023-07-21 ENCOUNTER — Other Ambulatory Visit (HOSPITAL_COMMUNITY): Payer: Self-pay

## 2023-07-21 ENCOUNTER — Encounter: Payer: Self-pay | Admitting: Medical-Surgical

## 2023-07-21 LAB — CBC WITH DIFFERENTIAL/PLATELET
Basophils Absolute: 0 10*3/uL (ref 0.0–0.2)
Basos: 1 %
EOS (ABSOLUTE): 0.1 10*3/uL (ref 0.0–0.4)
Eos: 2 %
Hematocrit: 38.3 % (ref 34.0–46.6)
Hemoglobin: 12.4 g/dL (ref 11.1–15.9)
Immature Grans (Abs): 0 10*3/uL (ref 0.0–0.1)
Immature Granulocytes: 0 %
Lymphocytes Absolute: 2.6 10*3/uL (ref 0.7–3.1)
Lymphs: 42 %
MCH: 32.7 pg (ref 26.6–33.0)
MCHC: 32.4 g/dL (ref 31.5–35.7)
MCV: 101 fL — ABNORMAL HIGH (ref 79–97)
Monocytes Absolute: 0.7 10*3/uL (ref 0.1–0.9)
Monocytes: 11 %
Neutrophils Absolute: 2.8 10*3/uL (ref 1.4–7.0)
Neutrophils: 44 %
Platelets: 261 10*3/uL (ref 150–450)
RBC: 3.79 x10E6/uL (ref 3.77–5.28)
RDW: 11.9 % (ref 11.7–15.4)
WBC: 6.2 10*3/uL (ref 3.4–10.8)

## 2023-07-21 LAB — CMP14+EGFR
ALT: 11 IU/L (ref 0–32)
AST: 15 IU/L (ref 0–40)
Albumin: 4.4 g/dL (ref 3.9–4.9)
Alkaline Phosphatase: 52 IU/L (ref 44–121)
BUN/Creatinine Ratio: 20 (ref 9–23)
BUN: 14 mg/dL (ref 6–20)
Bilirubin Total: 0.2 mg/dL (ref 0.0–1.2)
CO2: 22 mmol/L (ref 20–29)
Calcium: 9.1 mg/dL (ref 8.7–10.2)
Chloride: 105 mmol/L (ref 96–106)
Creatinine, Ser: 0.69 mg/dL (ref 0.57–1.00)
Globulin, Total: 2.3 g/dL (ref 1.5–4.5)
Glucose: 76 mg/dL (ref 70–99)
Potassium: 4.3 mmol/L (ref 3.5–5.2)
Sodium: 140 mmol/L (ref 134–144)
Total Protein: 6.7 g/dL (ref 6.0–8.5)
eGFR: 113 mL/min/{1.73_m2} (ref >59)

## 2023-07-23 ENCOUNTER — Other Ambulatory Visit (HOSPITAL_COMMUNITY): Payer: Self-pay

## 2023-07-24 ENCOUNTER — Other Ambulatory Visit (HOSPITAL_COMMUNITY): Payer: Self-pay

## 2023-07-24 ENCOUNTER — Encounter (HOSPITAL_COMMUNITY): Payer: Self-pay

## 2023-07-25 ENCOUNTER — Encounter: Payer: Self-pay | Admitting: Medical-Surgical

## 2023-07-25 ENCOUNTER — Other Ambulatory Visit (HOSPITAL_COMMUNITY): Payer: Self-pay

## 2023-07-26 ENCOUNTER — Ambulatory Visit (INDEPENDENT_AMBULATORY_CARE_PROVIDER_SITE_OTHER): Payer: 59

## 2023-07-26 DIAGNOSIS — M542 Cervicalgia: Secondary | ICD-10-CM | POA: Diagnosis not present

## 2023-08-01 ENCOUNTER — Other Ambulatory Visit (HOSPITAL_COMMUNITY): Payer: Self-pay

## 2023-08-03 ENCOUNTER — Telehealth: Payer: Self-pay

## 2023-08-03 NOTE — Telephone Encounter (Signed)
Initiated Prior authorization ZOX:[id number] 8-90MG  er tablets Via: Covermymeds Case/Key:[id number] Status: Pending as of  07/30/23 Reason: Notified Pt via: Mychart

## 2023-08-17 ENCOUNTER — Other Ambulatory Visit (HOSPITAL_COMMUNITY): Payer: Self-pay

## 2023-08-20 ENCOUNTER — Other Ambulatory Visit (HOSPITAL_COMMUNITY): Payer: Self-pay

## 2023-08-24 DIAGNOSIS — M5412 Radiculopathy, cervical region: Secondary | ICD-10-CM | POA: Diagnosis not present

## 2023-09-11 ENCOUNTER — Telehealth: Payer: Self-pay | Admitting: *Deleted

## 2023-09-11 NOTE — Telephone Encounter (Addendum)
[facility] Letter received to [facility] on 09/06/23 requesting withdrawal from the above study signed by Lamarr Lulas.  Called Jameson Morrow to request additional information for the withdrawal request form. Left VM on 09/07/23 requesting call back at their convenience. Anielle called back and I confirmed I was speaking with Kafi Dotter [phone] by using name and DOB. Informed Samaya the reason for this call is to follow-up on a recent request for withdrawal from the GeneConnect study. Informed participant that we need some more information to complete the withdrawal form for [employer]. Robyn confirmed their email address that is associated with their MyChart account as the one listed in EMR.   They request that any data already collected for this study be deleted and no further data collected in the future.   They did not provide a blood or saliva sample for the study.  Krystianna volunteered reason for withdrawal is due to concerns that any positive genetic results could affect their ability to obtain insurance in the future.  Ruthetta was thanked for their time and informed that this research nurse will complete the withdrawal form and submit to [employer] as requested. They verbalized understanding.   Domenica Reamer, BSN, RN, [employer] Clinical Research Nurse II [phone] 09/11/2023 10:29 AM

## 2023-10-26 ENCOUNTER — Other Ambulatory Visit: Payer: Self-pay

## 2023-10-26 ENCOUNTER — Encounter: Payer: Self-pay | Admitting: Pharmacist

## 2023-10-29 ENCOUNTER — Other Ambulatory Visit (HOSPITAL_COMMUNITY): Payer: Self-pay

## 2023-10-30 ENCOUNTER — Other Ambulatory Visit: Payer: Self-pay

## 2023-11-06 ENCOUNTER — Other Ambulatory Visit (HOSPITAL_COMMUNITY): Payer: Self-pay

## 2023-11-06 ENCOUNTER — Other Ambulatory Visit: Payer: Self-pay

## 2023-11-06 ENCOUNTER — Encounter (HOSPITAL_COMMUNITY): Payer: Self-pay

## 2023-11-07 ENCOUNTER — Other Ambulatory Visit: Payer: Self-pay

## 2023-11-07 ENCOUNTER — Other Ambulatory Visit (HOSPITAL_COMMUNITY): Payer: Self-pay

## 2023-12-19 ENCOUNTER — Encounter (HOSPITAL_COMMUNITY): Payer: Self-pay

## 2023-12-19 ENCOUNTER — Telehealth: Payer: Commercial Managed Care - PPO | Admitting: Physician Assistant

## 2023-12-19 ENCOUNTER — Other Ambulatory Visit (HOSPITAL_COMMUNITY): Payer: Self-pay

## 2023-12-19 ENCOUNTER — Other Ambulatory Visit: Payer: Self-pay

## 2023-12-19 DIAGNOSIS — R6889 Other general symptoms and signs: Secondary | ICD-10-CM

## 2023-12-19 MED ORDER — XOFLUZA (40 MG DOSE) 1 X 40 MG PO TBPK
40.0000 mg | ORAL_TABLET | Freq: Once | ORAL | 0 refills | Status: DC
  Filled 2023-12-19: qty 1, 1d supply, fill #0

## 2023-12-19 MED ORDER — XOFLUZA (40 MG DOSE) 1 X 40 MG PO TBPK: 40.0000 mg | ORAL_TABLET | Freq: Once | ORAL | 0 refills | Status: AC

## 2023-12-19 NOTE — Progress Notes (Signed)
I have spent 5 minutes in review of e-visit questionnaire, review and updating patient chart, medical decision making and response to patient.   Mia Milan Cody Jacklynn Dehaas, PA-C    

## 2023-12-19 NOTE — Addendum Note (Signed)
Addended by: Margaretann Loveless on: 12/19/2023 12:35 PM   Modules accepted: Orders

## 2023-12-19 NOTE — Progress Notes (Signed)
E visit for Flu like symptoms   We are sorry that you are not feeling well.  Here is how we plan to help! Based on what you have shared with me it looks like you may have a respiratory virus that may be influenza.  Influenza or "the flu" is   an infection caused by a respiratory virus. The flu virus is highly contagious and persons who did not receive their yearly flu vaccination may "catch" the flu from close contact.  We have anti-viral medications to treat the viruses that cause this infection. They are not a "cure" and only shorten the course of the infection. These prescriptions are most effective when they are given within the first 2 days of "flu" symptoms. Antiviral medication are indicated if you have a high risk of complications from the flu. You should  also consider an antiviral medication if you are in close contact with someone who is at risk. These medications can help patients avoid complications from the flu  but have side effects that you should know. Possible side effects from Tamiflu or oseltamivir include nausea, vomiting, diarrhea, dizziness, headaches, eye redness, sleep problems or other respiratory symptoms. You should not take Tamiflu if you have an allergy to oseltamivir or any to the ingredients in Tamiflu.  Based upon your symptoms and potential risk factors Xofluza Chiropractor) 40mg  tabs (1 tablet) once by mouth (if you a weigh between  88 pounds and 176 pounds- total of 40mg )  ANYONE WHO HAS FLU SYMPTOMS SHOULD: Stay home. The flu is highly contagious and going out or to work exposes others! Be sure to drink plenty of fluids. Water is fine as well as fruit juices, sodas and electrolyte beverages. You may want to stay away from caffeine or alcohol. If you are nauseated, try taking small sips of liquids. How do you know if you are getting enough fluid? Your urine should be a pale yellow or almost colorless. Get rest. Taking a steamy shower or using a humidifier may help  nasal congestion and ease sore throat pain. Using a saline nasal spray works much the same way. Cough drops, hard candies and sore throat lozenges may ease your cough. Line up a caregiver. Have someone check on you regularly.   GET HELP RIGHT AWAY IF: You cannot keep down liquids or your medications. You become short of breath Your fell like you are going to pass out or loose consciousness. Your symptoms persist after you have completed your treatment plan MAKE SURE YOU  Understand these instructions. Will watch your condition. Will get help right away if you are not doing well or get worse.  Your e-visit answers were reviewed by a board certified advanced clinical practitioner to complete your personal care plan.  Depending on the condition, your plan could have included both over the counter or prescription medications.  If there is a problem please reply  once you have received a response from your provider.  Your safety is important to [place].  If you have drug allergies check your prescription carefully.    You can use MyChart to ask questions about today's visit, request a non-urgent call back, or ask for a work or school excuse for 24 hours related to this e-Visit. If it has been greater than 24 hours you will need to follow up with your provider, or enter a new e-Visit to address those concerns.  You will get an e-mail in the next two days asking about your experience.  I  hope that your e-visit has been valuable and will speed your recovery. Thank you for using e-visits.

## 2023-12-20 ENCOUNTER — Other Ambulatory Visit (HOSPITAL_COMMUNITY): Payer: Self-pay

## 2023-12-20 MED ORDER — OSELTAMIVIR PHOSPHATE 75 MG PO CAPS: 75.0000 mg | ORAL_CAPSULE | Freq: Two times a day (BID) | ORAL | 0 refills | Status: AC

## 2023-12-20 NOTE — Addendum Note (Signed)
Addended by: Freddy Finner on: 12/20/2023 12:27 PM   Modules accepted: Orders

## 2024-01-14 ENCOUNTER — Other Ambulatory Visit: Payer: Self-pay | Admitting: Medical-Surgical

## 2024-01-14 DIAGNOSIS — Z1231 Encounter for screening mammogram for malignant neoplasm of breast: Secondary | ICD-10-CM

## 2024-01-16 ENCOUNTER — Ambulatory Visit: Payer: Commercial Managed Care - PPO

## 2024-01-16 DIAGNOSIS — Z1231 Encounter for screening mammogram for malignant neoplasm of breast: Secondary | ICD-10-CM | POA: Diagnosis not present

## 2024-01-20 ENCOUNTER — Encounter: Payer: Self-pay | Admitting: Medical-Surgical

## 2024-01-21 ENCOUNTER — Encounter: Payer: Self-pay | Admitting: Medical-Surgical

## 2024-01-21 NOTE — Telephone Encounter (Signed)
 Olivia Sutton,  shows upcoming appt on Thursday is for Mood follow up - sent the rest of the message for you to review.  Thank you,

## 2024-01-21 NOTE — Telephone Encounter (Signed)
 MMG results and message by provider Seen by patient Olivia Sutton on 01/21/2024  8:47 AM

## 2024-01-24 ENCOUNTER — Ambulatory Visit: Payer: Commercial Managed Care - PPO | Admitting: Medical-Surgical

## 2024-01-24 ENCOUNTER — Encounter: Payer: Self-pay | Admitting: Medical-Surgical

## 2024-01-24 VITALS — BP 111/67 | HR 57 | Resp 20 | Ht 64.5 in | Wt 166.0 lb

## 2024-01-24 DIAGNOSIS — R635 Abnormal weight gain: Secondary | ICD-10-CM

## 2024-01-24 DIAGNOSIS — Z8659 Personal history of other mental and behavioral disorders: Secondary | ICD-10-CM | POA: Diagnosis not present

## 2024-01-24 DIAGNOSIS — L659 Nonscarring hair loss, unspecified: Secondary | ICD-10-CM | POA: Diagnosis not present

## 2024-01-24 NOTE — Progress Notes (Signed)
        Established patient visit  History, exam, impression, and plan:  1. Weight gain (Primary) 2. Hair loss Very pleasant 38 year old female presenting today for follow up. She continues to have issues with weight gain despite dietary modification, portion control, and regular intentional exercise. Concerned that there may be a hormonal imbalance or something that is contributing. Has also noted an increase in hair loss along the top of her head extending into the crown. No recent changes or nutritional deficiencies. No severe limitation of calories. Tried Contrave but this made her feel crazy even at the half dose. Decided to discontinue the medication on her own. Discussed her concerns and agree that Contrave is not well tolerated. Added to the intolerance list. Plan to check labs below to evaluate for potential metabolic cause of weight gain and hair loss. Plan further intervention depending on labs. Discussed option for using Finasteride and topical minoxidil for hair loss if desired.  - CBC with Differential/Platelet - CMP14+EGFR - Lipid panel - VITAMIN D 25 Hydroxy (Vit-D Deficiency, Fractures) - Vitamin B12 - TSH - T4, free - Iron, TIBC and Ferritin Panel - Testosterone - Hemoglobin A1c  3. History of anxiety Currently taking Zoloft 50mg  daily, tolerating well without side effects. Has been on this for several years and feels the medication work well to keep her mood stable. Denies SI/HI. Reviewed GeneSight report with patient and all questions answered at the time of the appointment. Continue Zoloft as prescribed.    Procedures performed this visit: None.  Return in about 6 weeks (around 03/06/2024) for hair loss follow up.  __________________________________ Thayer Ohm, DNP, APRN, FNP-BC Primary Care and Sports Medicine [facility]

## 2024-01-25 LAB — CMP14+EGFR
ALT: 15 [IU]/L (ref 0–32)
AST: 17 [IU]/L (ref 0–40)
Albumin: 4.2 g/dL (ref 3.9–4.9)
Alkaline Phosphatase: 55 [IU]/L (ref 44–121)
BUN/Creatinine Ratio: 18 (ref 9–23)
BUN: 12 mg/dL (ref 6–20)
Bilirubin Total: 0.3 mg/dL (ref 0.0–1.2)
CO2: 23 mmol/L (ref 20–29)
Calcium: 9.5 mg/dL (ref 8.7–10.2)
Chloride: 104 mmol/L (ref 96–106)
Creatinine, Ser: 0.68 mg/dL (ref 0.57–1.00)
Globulin, Total: 2.6 g/dL (ref 1.5–4.5)
Glucose: 74 mg/dL (ref 70–99)
Potassium: 4.9 mmol/L (ref 3.5–5.2)
Sodium: 140 mmol/L (ref 134–144)
Total Protein: 6.8 g/dL (ref 6.0–8.5)
eGFR: 114 mL/min/{1.73_m2} (ref >59)

## 2024-01-25 LAB — CBC WITH DIFFERENTIAL/PLATELET
Basophils Absolute: 0 10*3/uL (ref 0.0–0.2)
Basos: 0 %
EOS (ABSOLUTE): 0.1 10*3/uL (ref 0.0–0.4)
Eos: 3 %
Hematocrit: 37.5 % (ref 34.0–46.6)
Hemoglobin: 12.7 g/dL (ref 11.1–15.9)
Immature Grans (Abs): 0 10*3/uL (ref 0.0–0.1)
Immature Granulocytes: 0 %
Lymphocytes Absolute: 1.7 10*3/uL (ref 0.7–3.1)
Lymphs: 36 %
MCH: 33.7 pg — ABNORMAL HIGH (ref 26.6–33.0)
MCHC: 33.9 g/dL (ref 31.5–35.7)
MCV: 100 fL — ABNORMAL HIGH (ref 79–97)
Monocytes Absolute: 0.5 10*3/uL (ref 0.1–0.9)
Monocytes: 10 %
Neutrophils Absolute: 2.4 10*3/uL (ref 1.4–7.0)
Neutrophils: 51 %
Platelets: 235 10*3/uL (ref 150–450)
RBC: 3.77 x10E6/uL (ref 3.77–5.28)
RDW: 12 % (ref 11.7–15.4)
WBC: 4.7 10*3/uL (ref 3.4–10.8)

## 2024-01-25 LAB — IRON,TIBC AND FERRITIN PANEL
Ferritin: 44 ng/mL (ref 15–150)
Iron Saturation: 81 % (ref 15–55)
Iron: 223 ug/dL — ABNORMAL HIGH (ref 27–159)
Total Iron Binding Capacity: 277 ug/dL (ref 250–450)
UIBC: 54 ug/dL — ABNORMAL LOW (ref 131–425)

## 2024-01-25 LAB — HEMOGLOBIN A1C
Est. average glucose Bld gHb Est-mCnc: 103 mg/dL
Hgb A1c MFr Bld: 5.2 % (ref 4.8–5.6)

## 2024-01-25 LAB — LIPID PANEL
Chol/HDL Ratio: 2.9 {ratio} (ref 0.0–4.4)
Cholesterol, Total: 148 mg/dL (ref 100–199)
HDL: 51 mg/dL (ref >39)
LDL Chol Calc (NIH): 84 mg/dL (ref 0–99)
Triglycerides: 65 mg/dL (ref 0–149)
VLDL Cholesterol Cal: 13 mg/dL (ref 5–40)

## 2024-01-25 LAB — TESTOSTERONE: Testosterone: 32 ng/dL (ref 8–60)

## 2024-01-25 LAB — VITAMIN B12: Vitamin B-12: 541 pg/mL (ref 232–1245)

## 2024-01-25 LAB — T4, FREE: Free T4: 1.19 ng/dL (ref 0.82–1.77)

## 2024-01-25 LAB — TSH: TSH: 0.915 u[IU]/mL (ref 0.450–4.500)

## 2024-01-25 LAB — VITAMIN D 25 HYDROXY (VIT D DEFICIENCY, FRACTURES): Vit D, 25-Hydroxy: 27.7 ng/mL — ABNORMAL LOW (ref 30.0–100.0)

## 2024-01-26 ENCOUNTER — Encounter: Payer: Self-pay | Admitting: Medical-Surgical

## 2024-01-26 DIAGNOSIS — R7989 Other specified abnormal findings of blood chemistry: Secondary | ICD-10-CM

## 2024-01-26 DIAGNOSIS — L659 Nonscarring hair loss, unspecified: Secondary | ICD-10-CM

## 2024-01-26 DIAGNOSIS — R635 Abnormal weight gain: Secondary | ICD-10-CM

## 2024-01-26 LAB — INSULIN, RANDOM: INSULIN: 12.6 u[IU]/mL (ref 2.6–24.9)

## 2024-01-26 LAB — CORTISOL: Cortisol: 5.7 ug/dL — ABNORMAL LOW (ref 6.2–19.4)

## 2024-02-04 ENCOUNTER — Other Ambulatory Visit (HOSPITAL_COMMUNITY): Payer: Self-pay

## 2024-02-04 ENCOUNTER — Other Ambulatory Visit: Payer: Self-pay

## 2024-02-04 MED ORDER — FINASTERIDE 5 MG PO TABS
2.5000 mg | ORAL_TABLET | Freq: Every day | ORAL | 1 refills | Status: DC
  Filled 2024-02-04: qty 45, 90d supply, fill #0
  Filled 2024-04-30: qty 45, 90d supply, fill #1

## 2024-03-06 ENCOUNTER — Encounter: Payer: Self-pay | Admitting: Medical-Surgical

## 2024-03-06 ENCOUNTER — Ambulatory Visit: Payer: Commercial Managed Care - PPO | Admitting: Medical-Surgical

## 2024-03-06 ENCOUNTER — Other Ambulatory Visit: Payer: Self-pay

## 2024-03-06 ENCOUNTER — Other Ambulatory Visit (HOSPITAL_COMMUNITY): Payer: Self-pay

## 2024-03-06 VITALS — BP 103/67 | HR 53 | Resp 20 | Ht 64.5 in | Wt 169.4 lb

## 2024-03-06 DIAGNOSIS — L659 Nonscarring hair loss, unspecified: Secondary | ICD-10-CM

## 2024-03-06 DIAGNOSIS — J3089 Other allergic rhinitis: Secondary | ICD-10-CM | POA: Diagnosis not present

## 2024-03-06 DIAGNOSIS — Z8659 Personal history of other mental and behavioral disorders: Secondary | ICD-10-CM

## 2024-03-06 MED ORDER — SERTRALINE HCL 50 MG PO TABS
50.0000 mg | ORAL_TABLET | Freq: Every day | ORAL | 3 refills | Status: AC
  Filled 2024-03-06 – 2024-04-30 (×2): qty 90, 90d supply, fill #0
  Filled 2024-10-26: qty 90, 90d supply, fill #1

## 2024-03-06 MED ORDER — LEVOCETIRIZINE DIHYDROCHLORIDE 5 MG PO TABS
5.0000 mg | ORAL_TABLET | Freq: Every evening | ORAL | 3 refills | Status: AC
  Filled 2024-03-06: qty 90, 90d supply, fill #0

## 2024-03-06 MED ORDER — OLOPATADINE HCL 0.2 % OP SOLN
1.0000 [drp] | Freq: Every day | OPHTHALMIC | 3 refills | Status: DC
  Filled 2024-03-06: qty 2.5, 25d supply, fill #0

## 2024-03-06 MED ORDER — AIRSUPRA 90-80 MCG/ACT IN AERO
2.0000 | INHALATION_SPRAY | Freq: Four times a day (QID) | RESPIRATORY_TRACT | 11 refills | Status: AC | PRN
  Filled 2024-03-06: qty 10.7, 30d supply, fill #0

## 2024-03-06 NOTE — Progress Notes (Signed)
        Established patient visit  History, exam, impression, and plan:  1. Hair loss (Primary) Very pleasant 38 year old female presenting today for follow-up on hair loss.  At our last visit she continued to be worried about hair loss as her hair is getting significantly thin and we have not been able to find an absolute contributor to it.  No dietary changes or medication changes to explain the hair loss.  After discussion, she was interested in starting finasteride.  She is sexually active with a female partner and pregnancy is not a concern.  I did counsel her on the importance of avoiding pregnancy while on this medication as it is teratogenic. Patient has verbalized understand. Today, she has been taking finasteride 2.5mg  daily, tolerating well without side effects. Has not seen benefit yet but is aware that this will likely take months before effects are noticeable. Continue finasteride as prescribed.   2. History of anxiety Long term treatment with Zoloft 50mg  daily. Symptoms well controlled without side effects. Denies SI/HI. Continue Zoloft as prescribed.  - sertraline (ZOLOFT) 50 MG tablet; Take 1 tablet (50 mg total) by mouth daily.  Dispense: 90 tablet; Refill: 3  3. Environmental and seasonal allergies Recent flare of environmental allergies leading to sinus congestion, sinus pressure, post-nasal drip, headache, and watery eyes. Has been taking Claritin seasonally for years but doesn't feel like it works well anymore. Switching to Xyzal 5mg  nightly. Adding Pataday eye drops for allergic conjunctivitis. Since albuterol inhaler has not been greatly helpful, switching to [place].   Procedures performed this visit: None.  Return in about 6 months (around 09/05/2024) for mood/hair loss follow up.  __________________________________ Thayer Ohm, DNP, APRN, FNP-BC Primary Care and Sports Medicine [facility]

## 2024-04-30 ENCOUNTER — Other Ambulatory Visit (HOSPITAL_COMMUNITY): Payer: Self-pay

## 2024-04-30 ENCOUNTER — Other Ambulatory Visit: Payer: Self-pay

## 2024-07-09 ENCOUNTER — Other Ambulatory Visit: Payer: Self-pay

## 2024-07-17 ENCOUNTER — Ambulatory Visit: Admitting: "Endocrinology

## 2024-07-17 ENCOUNTER — Encounter: Payer: Self-pay | Admitting: "Endocrinology

## 2024-07-17 VITALS — BP 106/80 | HR 84 | Ht 64.0 in | Wt 156.0 lb

## 2024-07-17 DIAGNOSIS — R7989 Other specified abnormal findings of blood chemistry: Secondary | ICD-10-CM | POA: Diagnosis not present

## 2024-07-17 DIAGNOSIS — L814 Other melanin hyperpigmentation: Secondary | ICD-10-CM | POA: Diagnosis not present

## 2024-07-17 DIAGNOSIS — L578 Other skin changes due to chronic exposure to nonionizing radiation: Secondary | ICD-10-CM | POA: Diagnosis not present

## 2024-07-17 DIAGNOSIS — D225 Melanocytic nevi of trunk: Secondary | ICD-10-CM | POA: Diagnosis not present

## 2024-07-17 NOTE — Progress Notes (Signed)
 Outpatient Endocrinology Note Olivia Birmingham, MD    Olivia Sutton [birth year] [id number]  Referring Provider: Willo Mini, NP Primary Care Provider: Willo Mini, NP Reason for consultation: Subjective   Assessment & Plan  Diagnoses and all orders for this visit:  Low serum cortisol level -     Cancel: Cortisol; Future -     Cancel: ACTH; Future -     Cancel: ACTH -     Cancel: Cortisol -     ACTH -     Cortisol  Cortisol level found low at 9 AM at 5.7 on 01/24/2024 done likely as a result of symptom of fatigue Patient's fatigue has since improved, especially since she has started doing workout, lost 13 pounds in the past 4 months, secondary to workout? No recent history of steroid intake at the time the blood draw was done Denies ongoing abdominal pain/nausea/vomiting/unintentional weight loss/low glucose/low BP Recommend 7 to 8 AM morning blood draw, if still low will follow-up with cosyntropin stimulation test Thyroid  labs WNL, no abnormal thyroid  medication Patient understands and agreed  Return in about 6 weeks (around 08/28/2024).   I have reviewed current medications, nurse's notes, allergies, vital signs, past medical and surgical history, family medical history, and social history for this encounter. Counseled patient on symptoms, examination findings, lab findings, imaging results, treatment decisions and monitoring and prognosis. The patient understood the recommendations and agrees with the treatment plan. All questions regarding treatment plan were fully answered.  Olivia Birmingham, MD  07/17/24   History of Present Illness HPI  Olivia Sutton is a 38 y.o. year old female who presents for evaluation of low morning cortisol.  Denies abdominal pain/nausea/vomiting/unintentional weight loss/low glucose/low BP anytime recently  Not been on steroid pills/shots C/o morning dizziness, fatigue has improved since low cortisol was drawn after she started doing  work out  No history of cancers No history of adrenal disease in family    Physical Exam  BP 106/80   Pulse 84   Ht 5' 4 (1.626 m)   Wt 156 lb (70.8 kg)   SpO2 96%   BMI 26.78 kg/m    Constitutional: well developed, well nourished Head: normocephalic, atraumatic Eyes: sclera anicteric, no redness Neck: supple Lungs: normal respiratory effort Neurology: alert and oriented Skin: dry, no appreciable rashes Musculoskeletal: no appreciable defects Psychiatric: normal mood and affect   Current Medications Patient's Medications  New Prescriptions   No medications on file  Previous Medications   ACETAMINOPHEN  (TYLENOL ) 500 MG TABLET    Take 500 mg by mouth every 6 (six) hours as needed.   ALBUTEROL -BUDESONIDE  (AIRSUPRA ) 90-80 MCG/ACT AERO    Inhale 2 puffs into the lungs every 6 (six) hours as needed.   FINASTERIDE  (PROSCAR ) 5 MG TABLET    Take 0.5 tablets (2.5 mg total) by mouth daily.   IBUPROFEN (ADVIL) 200 MG TABLET    Take 200 mg by mouth every 6 (six) hours as needed.   LEVOCETIRIZINE (XYZAL ) 5 MG TABLET    Take 1 tablet (5 mg total) by mouth every evening.   LORATADINE (CLARITIN) 10 MG TABLET    Take 10 mg by mouth daily.   OLOPATADINE  HCL 0.2 % SOLN    Apply 1 drop to eye daily.   SERTRALINE  (ZOLOFT ) 50 MG TABLET    Take 1 tablet (50 mg total) by mouth daily.  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 No pertinent surgical history.  Family History family history includes Breast cancer in her cousin, maternal aunt, maternal aunt, and maternal grandmother; Diabetes in her father; Heart attack in her maternal grandfather; High blood pressure in her father; Stroke in her paternal grandfather.  Social History Social History   Socioeconomic History   Marital status: Media planner    Spouse name: Not on file   Number of children: 2   Years of education: Not on file   Highest education level: Not on file  Occupational History   Occupation: Nurse  Tobacco Use   Smoking status: Never   Smokeless tobacco: Never  Vaping Use   Vaping status: Never Used  Substance and Sexual Activity   Alcohol use: Yes    Alcohol/week: 1.0 standard drink of alcohol    Types: 1 Standard drinks or equivalent per week   Drug use: Never   Sexual activity: Yes    Partners: Female    Birth control/protection: None  Other Topics Concern   Not on file  Social History Narrative   Not on file   Social Drivers of Health   Financial Resource Strain: Not on file  Food Insecurity: Not on file  Transportation Needs: Not on file  Physical Activity: Not on file  Stress: Not on file  Social Connections: Unknown (05/22/2020)   Received from [facility]   Social Isolation    How often do you see or talk to people that you care about and feel close to?: Not on file  Intimate Partner Violence: Not on file    Lab Results  Component Value Date   CHOL 148 01/24/2024   Lab Results  Component Value Date   HDL 51 01/24/2024   Lab Results  Component Value Date   LDLCALC 84 01/24/2024   Lab Results  Component Value Date   TRIG 65 01/24/2024   Lab Results  Component Value Date   CHOLHDL 2.9 01/24/2024   Lab Results  Component Value Date   CREATININE 0.68 01/24/2024   No results found for: GFR    Component Value Date/Time   NA 140 01/24/2024 0906   K 4.9  01/24/2024 0906   CL 104 01/24/2024 0906   CO2 23 01/24/2024 0906   GLUCOSE 74 01/24/2024 0906   GLUCOSE 83 07/17/2022 0000   BUN 12 01/24/2024 0906   CREATININE 0.68 01/24/2024 0906   CREATININE 0.62 07/17/2022 0000   CALCIUM 9.5 01/24/2024 0906   PROT 6.8 01/24/2024 0906   ALBUMIN 4.2 01/24/2024 0906   AST 17 01/24/2024 0906   ALT 15 01/24/2024 0906   ALKPHOS 55 01/24/2024 0906   BILITOT 0.3 01/24/2024 0906   GFRNONAA 113 05/17/2021 0000   GFRAA 131 05/17/2021 0000      Latest Ref Rng & Units 01/24/2024    9:06 AM 07/20/2023    3:16 PM 07/17/2022   12:00 AM  BMP  Glucose 70 - 99 mg/dL 74  76  83   BUN 6 - 20 mg/dL 12  14  11    Creatinine 0.57 - 1.00 mg/dL 9.31  9.30  9.37   BUN/Creat Ratio 9 - 23 18  20   SEE NOTE:   Sodium 134 - 144 mmol/L 140  140  141   Potassium 3.5 - 5.2 mmol/L 4.9  4.3  3.9   Chloride 96 - 106 mmol/L 104  105  107   CO2 20 - 29 mmol/L 23  22  25    Calcium 8.7 - 10.2 mg/dL 9.5  9.1  9.5        Component Value Date/Time   WBC 4.7 01/24/2024 0906   WBC 5.5 07/17/2022 0000   RBC 3.77 01/24/2024 0906   RBC 3.94 07/17/2022 0000   HGB 12.7 01/24/2024 0906   HCT 37.5 01/24/2024 0906   PLT 235 01/24/2024 0906   MCV 100 (H) 01/24/2024 0906   MCH 33.7 (H) 01/24/2024 0906   MCH 33.2 (H) 07/17/2022 0000   MCHC 33.9 01/24/2024 0906   MCHC 33.3 07/17/2022 0000   RDW 12.0 01/24/2024 0906   LYMPHSABS 1.7 01/24/2024 0906   EOSABS 0.1 01/24/2024 0906   BASOSABS 0.0 01/24/2024 0906   Lab Results  Component Value Date   TSH 0.915 01/24/2024   TSH 0.84 05/17/2021   TSH 0.68 05/07/2019   FREET4 1.19 01/24/2024         Parts of this note may have been dictated using voice recognition software. There may be variances in spelling and vocabulary which are unintentional. Not all errors are proofread. Please notify the dino if any discrepancies are noted or if the meaning of any statement is not clear.

## 2024-07-24 ENCOUNTER — Other Ambulatory Visit: Payer: Self-pay

## 2024-07-24 ENCOUNTER — Ambulatory Visit (INDEPENDENT_AMBULATORY_CARE_PROVIDER_SITE_OTHER): Admitting: Medical-Surgical

## 2024-07-24 ENCOUNTER — Encounter: Payer: Self-pay | Admitting: Medical-Surgical

## 2024-07-24 ENCOUNTER — Other Ambulatory Visit (HOSPITAL_COMMUNITY): Payer: Self-pay

## 2024-07-24 VITALS — BP 94/61 | HR 60 | Resp 20 | Ht 64.0 in | Wt 155.0 lb

## 2024-07-24 DIAGNOSIS — Z0000 Encounter for general adult medical examination without abnormal findings: Secondary | ICD-10-CM | POA: Diagnosis not present

## 2024-07-24 DIAGNOSIS — R7989 Other specified abnormal findings of blood chemistry: Secondary | ICD-10-CM

## 2024-07-24 DIAGNOSIS — Z13220 Encounter for screening for lipoid disorders: Secondary | ICD-10-CM | POA: Diagnosis not present

## 2024-07-24 DIAGNOSIS — L659 Nonscarring hair loss, unspecified: Secondary | ICD-10-CM

## 2024-07-24 MED ORDER — FINASTERIDE 5 MG PO TABS
2.5000 mg | ORAL_TABLET | Freq: Every day | ORAL | 3 refills | Status: AC
  Filled 2024-07-24: qty 45, 90d supply, fill #0

## 2024-07-24 NOTE — Patient Instructions (Signed)
 Preventive Care 58-40 Years Old, Female  Preventive care refers to lifestyle choices and visits with your health care provider that can promote health and wellness. Preventive care visits are also called wellness exams.  What can I expect for my preventive care visit?  Counseling  Your health care provider may ask you questions about your:  Medical history, including:  Past medical problems.  Family medical history.  Pregnancy history.  Current health, including:  Menstrual cycle.  Method of birth control.  Emotional well-being.  Home life and relationship well-being.  Sexual activity and sexual health.  Lifestyle, including:  Alcohol, nicotine or tobacco, and drug use.  Access to firearms.  Diet, exercise, and sleep habits.  Work and work Astronomer.  Sunscreen use.  Safety issues such as seatbelt and bike helmet use.  Physical exam  Your health care provider will check your:  Height and weight. These may be used to calculate your BMI (body mass index). BMI is a measurement that tells if you are at a healthy weight.  Waist circumference. This measures the distance around your waistline. This measurement also tells if you are at a healthy weight and may help predict your risk of certain diseases, such as type 2 diabetes and high blood pressure.  Heart rate and blood pressure.  Body temperature.  Skin for abnormal spots.  What immunizations do I need?    Vaccines are usually given at various ages, according to a schedule. Your health care provider will recommend vaccines for you based on your age, medical history, and lifestyle or other factors, such as travel or where you work.  What tests do I need?  Screening  Your health care provider may recommend screening tests for certain conditions. This may include:  Lipid and cholesterol levels.  Diabetes screening. This is done by checking your blood sugar (glucose) after you have not eaten for a while (fasting).  Pelvic exam and Pap test.  Hepatitis B test.  Hepatitis C  test.  HIV (human immunodeficiency virus) test.  STI (sexually transmitted infection) testing, if you are at risk.  Lung cancer screening.  Colorectal cancer screening.  Mammogram. Talk with your health care provider about when you should start having regular mammograms. This may depend on whether you have a family history of breast cancer.  BRCA-related cancer screening. This may be done if you have a family history of breast, ovarian, tubal, or peritoneal cancers.  Bone density scan. This is done to screen for osteoporosis.  Talk with your health care provider about your test results, treatment options, and if necessary, the need for more tests.  Follow these instructions at home:  Eating and drinking    Eat a diet that includes fresh fruits and vegetables, whole grains, lean protein, and low-fat dairy products.  Take vitamin and mineral supplements as recommended by your health care provider.  Do not drink alcohol if:  Your health care provider tells you not to drink.  You are pregnant, may be pregnant, or are planning to become pregnant.  If you drink alcohol:  Limit how much you have to 0-1 drink a day.  Know how much alcohol is in your drink. In the [place] one drink equals one 12 oz bottle of beer (355 mL), one 5 oz glass of wine (148 mL), or one 1 oz glass of hard liquor (44 mL).  Lifestyle  Brush your teeth every morning and night with fluoride toothpaste. Floss one time each day.  Exercise for at least  30 minutes 5 or more days each week.  Do not use any products that contain nicotine or tobacco. These products include cigarettes, chewing tobacco, and vaping devices, such as e-cigarettes. If you need help quitting, ask your health care provider.  Do not use drugs.  If you are sexually active, practice safe sex. Use a condom or other form of protection to prevent STIs.  If you do not wish to become pregnant, use a form of birth control. If you plan to become pregnant, see your health care provider for a  prepregnancy visit.  Take aspirin only as told by your health care provider. Make sure that you understand how much to take and what form to take. Work with your health care provider to find out whether it is safe and beneficial for you to take aspirin daily.  Find healthy ways to manage stress, such as:  Meditation, yoga, or listening to music.  Journaling.  Talking to a trusted person.  Spending time with friends and family.  Minimize exposure to UV radiation to reduce your risk of skin cancer.  Safety  Always wear your seat belt while driving or riding in a vehicle.  Do not drive:  If you have been drinking alcohol. Do not ride with someone who has been drinking.  When you are tired or distracted.  While texting.  If you have been using any mind-altering substances or drugs.  Wear a helmet and other protective equipment during sports activities.  If you have firearms in your house, make sure you follow all gun safety procedures.  Seek help if you have been physically or sexually abused.  What's next?  Visit your health care provider once a year for an annual wellness visit.  Ask your health care provider how often you should have your eyes and teeth checked.  Stay up to date on all vaccines.  This information is not intended to replace advice given to you by your health care provider. Make sure you discuss any questions you have with your health care provider.  Document Revised: 05/11/2021 Document Reviewed: 05/11/2021  Elsevier Patient Education  2024 [employer].

## 2024-07-24 NOTE — Progress Notes (Signed)
 Complete physical exam  Patient: Olivia Sutton   DOB: March 23, 1986   38 y.o. Female  MRN: [id number]  Subjective:    Chief Complaint  Patient presents with   Annual Exam    Bernadette Gores is a 38 y.o. female who presents today for a complete physical exam. She reports consuming a general diet. Weight lifting and cardio exercises several times per week.  She generally feels well. She reports sleeping well. She does not have additional problems to discuss today.    Most recent fall risk assessment:    07/24/2024    8:40 AM  Fall Risk   Falls in the past year? 0  Number falls in past yr: 0  Injury with Fall? 0  Risk for fall due to : No Fall Risks  Follow up Falls evaluation completed     Most recent depression screenings:    07/24/2024    8:40 AM 03/06/2024    9:10 AM  PHQ 2/9 Scores  PHQ - 2 Score 0 0  PHQ- 9 Score 1     Vision:Not within last year  and Dental: No current dental problems and Receives regular dental care    Patient Care Team: Willo Mini, NP as PCP - General (Nurse Practitioner)   Outpatient Medications Prior to Visit  Medication Sig   acetaminophen  (TYLENOL ) 500 MG tablet Take 500 mg by mouth every 6 (six) hours as needed.   Albuterol -Budesonide  (AIRSUPRA ) 90-80 MCG/ACT AERO Inhale 2 puffs into the lungs every 6 (six) hours as needed.   ibuprofen (ADVIL) 200 MG tablet Take 200 mg by mouth every 6 (six) hours as needed.   levocetirizine (XYZAL ) 5 MG tablet Take 1 tablet (5 mg total) by mouth every evening.   sertraline  (ZOLOFT ) 50 MG tablet Take 1 tablet (50 mg total) by mouth daily.   [DISCONTINUED] finasteride  (PROSCAR ) 5 MG tablet Take 0.5 tablets (2.5 mg total) by mouth daily.   [DISCONTINUED] loratadine (CLARITIN) 10 MG tablet Take 10 mg by mouth daily.   [DISCONTINUED] Olopatadine  HCl 0.2 % SOLN Apply 1 drop to eye daily.   No facility-administered medications prior to visit.    Review of Systems  Constitutional:  Negative for chills,  fever, malaise/fatigue and weight loss.  HENT:  Negative for congestion, ear pain, hearing loss, sinus pain and sore throat.   Eyes:  Positive for blurred vision. Negative for photophobia and pain.  Respiratory:  Negative for cough, shortness of breath and wheezing.   Cardiovascular:  Negative for chest pain, palpitations and leg swelling.  Gastrointestinal:  Negative for abdominal pain, constipation, diarrhea, heartburn, nausea and vomiting.  Genitourinary:  Negative for dysuria, frequency and urgency.  Musculoskeletal:  Negative for falls and neck pain.  Skin:  Negative for itching and rash.  Neurological:  Positive for dizziness. Negative for weakness and headaches.  Endo/Heme/Allergies:  Negative for polydipsia. Does not bruise/bleed easily.  Psychiatric/Behavioral:  Negative for depression, substance abuse and suicidal ideas. The patient is not nervous/anxious.      Objective:     BP 94/61 (BP Location: Right Arm, Cuff Size: Normal)   Pulse 60   Resp 20   Ht 5' 4 (1.626 m)   Wt 155 lb (70.3 kg)   SpO2 99%   BMI 26.61 kg/m    Physical Exam   No results found for any visits on 07/24/24.     Assessment & Plan:    Routine Health Maintenance and Physical Exam  Immunization History  Administered Date(s) Administered  Influenza,inj,Quad PF,6+ Mos 09/08/2018, 01/21/2019, 08/20/2019   Influenza-Unspecified 10/08/2009, 08/31/2015, 08/20/2020, 08/27/2021, 08/24/2023   PFIZER(Purple Top)SARS-COV-2 Vaccination 11/10/2019, 11/24/2019, 09/03/2020   PPD Test 03/24/2014, 03/24/2015, 03/21/2016   Tdap 06/20/2010, 02/26/2018, 01/16/2019   Health Maintenance  Topic Date Due   Hepatitis B Vaccines 19-59 Average Risk (1 of 3 - 19+ 3-dose series) Never done   HPV VACCINES (1 - 3-dose SCDM series) Never done   COVID-19 Vaccine (4 - 2024-25 season) 08/09/2024 (Originally 07/29/2023)   INFLUENZA VACCINE  02/24/2025 (Originally 06/27/2024)   MAMMOGRAM  01/15/2025   Cervical Cancer Screening  (HPV/Pap Cotest)  04/13/2025   DTaP/Tdap/Td (4 - Td or Tdap) 01/16/2029   HIV Screening  Completed   Pneumococcal Vaccine  Aged Out   Meningococcal B Vaccine  Aged Out   Hepatitis C Screening  Discontinued    Discussed health benefits of physical activity, and encouraged her to engage in regular exercise appropriate for her age and condition.  1. Annual physical exam (Primary) Checking labs as below.  UTD on dental care, recommend updating vision exam. Wellness information provided with AVS. - CBC with Differential/Platelet - CMP14+EGFR - Lipid panel  2. Low serum cortisol level Was seen by endocrinology who recommended rechecking an a.m. cortisol and ACTH.  The orders were placed but to be drawn at a [facility].  Since that is quite a drive from her location, she would like to have them done with her physical labs in the morning.  Orders confirmed in the endocrinology note and placed today. - ACTH - Cortisol  3. Lipid screening Checking lipid. - Lipid panel  4. Hair loss Doing well overall.  Continue finasteride  2.5 mg daily. - finasteride  (PROSCAR ) 5 MG tablet; Take 0.5 tablets (2.5 mg total) by mouth daily.  Dispense: 45 tablet; Refill: 3  Return in about 1 year (around 07/24/2025) for annual physical exam or sooner if needed.     Ketzaly Cardella, NP

## 2024-07-25 DIAGNOSIS — Z0000 Encounter for general adult medical examination without abnormal findings: Secondary | ICD-10-CM | POA: Diagnosis not present

## 2024-07-25 DIAGNOSIS — R718 Other abnormality of red blood cells: Secondary | ICD-10-CM | POA: Diagnosis not present

## 2024-07-25 DIAGNOSIS — Z13220 Encounter for screening for lipoid disorders: Secondary | ICD-10-CM | POA: Diagnosis not present

## 2024-07-25 DIAGNOSIS — R7989 Other specified abnormal findings of blood chemistry: Secondary | ICD-10-CM | POA: Diagnosis not present

## 2024-07-27 ENCOUNTER — Ambulatory Visit: Payer: Self-pay | Admitting: Medical-Surgical

## 2024-07-28 LAB — CMP14+EGFR
ALT: 8 IU/L (ref 0–32)
AST: 12 IU/L (ref 0–40)
Albumin: 4.5 g/dL (ref 3.9–4.9)
Alkaline Phosphatase: 49 IU/L (ref 44–121)
BUN/Creatinine Ratio: 15 (ref 9–23)
BUN: 11 mg/dL (ref 6–24)
Bilirubin Total: 0.3 mg/dL (ref 0.0–1.2)
CO2: 21 mmol/L (ref 20–29)
Calcium: 9.3 mg/dL (ref 8.7–10.2)
Chloride: 103 mmol/L (ref 96–106)
Creatinine, Ser: 0.72 mg/dL (ref 0.57–1.00)
Globulin, Total: 2.3 g/dL (ref 1.5–4.5)
Glucose: 77 mg/dL (ref 70–99)
Potassium: 4.7 mmol/L (ref 3.5–5.2)
Sodium: 140 mmol/L (ref 134–144)
Total Protein: 6.8 g/dL (ref 6.0–8.5)
eGFR: 108 mL/min/1.73 (ref >59)

## 2024-07-28 LAB — CBC WITH DIFFERENTIAL/PLATELET
Basophils Absolute: 0 x10E3/uL (ref 0.0–0.2)
Basos: 1 %
EOS (ABSOLUTE): 0.1 x10E3/uL (ref 0.0–0.4)
Eos: 3 %
Hematocrit: 41 % (ref 34.0–46.6)
Hemoglobin: 13.3 g/dL (ref 11.1–15.9)
Immature Grans (Abs): 0 x10E3/uL (ref 0.0–0.1)
Immature Granulocytes: 0 %
Lymphocytes Absolute: 1.6 x10E3/uL (ref 0.7–3.1)
Lymphs: 36 %
MCH: 33.4 pg — ABNORMAL HIGH (ref 26.6–33.0)
MCHC: 32.4 g/dL (ref 31.5–35.7)
MCV: 103 fL — ABNORMAL HIGH (ref 79–97)
Monocytes Absolute: 0.5 x10E3/uL (ref 0.1–0.9)
Monocytes: 11 %
Neutrophils Absolute: 2.2 x10E3/uL (ref 1.4–7.0)
Neutrophils: 49 %
Platelets: 249 x10E3/uL (ref 150–450)
RBC: 3.98 x10E6/uL (ref 3.77–5.28)
RDW: 11.7 % (ref 11.7–15.4)
WBC: 4.4 x10E3/uL (ref 3.4–10.8)

## 2024-07-28 LAB — LIPID PANEL
Chol/HDL Ratio: 3.4 ratio (ref 0.0–4.4)
Cholesterol, Total: 134 mg/dL (ref 100–199)
HDL: 40 mg/dL (ref >39)
LDL Chol Calc (NIH): 85 mg/dL (ref 0–99)
Triglycerides: 33 mg/dL (ref 0–149)
VLDL Cholesterol Cal: 9 mg/dL (ref 5–40)

## 2024-07-28 LAB — CORTISOL: Cortisol: 9.9 ug/dL (ref 6.2–19.4)

## 2024-07-28 LAB — ACTH: ACTH: 11.1 pg/mL (ref 7.2–63.3)

## 2024-07-29 NOTE — Telephone Encounter (Signed)
 Added request for B12 and folate to existing blood in [facility]

## 2024-07-30 ENCOUNTER — Encounter: Payer: Self-pay | Admitting: Medical-Surgical

## 2024-07-30 LAB — B12 AND FOLATE PANEL
Folate: 8.8 ng/mL (ref >3.0)
Vitamin B-12: 453 pg/mL (ref 232–1245)

## 2024-07-30 LAB — SPECIMEN STATUS REPORT

## 2024-08-28 ENCOUNTER — Ambulatory Visit: Admitting: "Endocrinology

## 2024-09-04 ENCOUNTER — Ambulatory Visit: Admitting: Medical-Surgical

## 2024-09-04 ENCOUNTER — Encounter: Payer: Self-pay | Admitting: Medical-Surgical

## 2024-09-04 ENCOUNTER — Ambulatory Visit: Attending: Medical-Surgical

## 2024-09-04 ENCOUNTER — Ambulatory Visit (INDEPENDENT_AMBULATORY_CARE_PROVIDER_SITE_OTHER)

## 2024-09-04 VITALS — BP 98/62 | HR 70 | Ht 64.0 in | Wt 154.2 lb

## 2024-09-04 DIAGNOSIS — R55 Syncope and collapse: Secondary | ICD-10-CM

## 2024-09-04 DIAGNOSIS — R079 Chest pain, unspecified: Secondary | ICD-10-CM

## 2024-09-04 DIAGNOSIS — R918 Other nonspecific abnormal finding of lung field: Secondary | ICD-10-CM | POA: Diagnosis not present

## 2024-09-04 DIAGNOSIS — R42 Dizziness and giddiness: Secondary | ICD-10-CM | POA: Diagnosis not present

## 2024-09-04 NOTE — Progress Notes (Unsigned)
 EP to read.

## 2024-09-04 NOTE — Progress Notes (Signed)
 Established patient visit   History of Present Illness   Discussed the use of AI scribe software for clinical note transcription with the patient, who gave verbal consent to proceed.  History of Present Illness   Olivia Sutton is a 38 year old female who presents with dizziness and recurrent chest pain.  Dizziness and visual disturbances - Dizziness occurs predominantly from morning to afternoon, resolving around noon or 1 PM - Not associated with positional changes; occurs even when sitting, including while driving - No associated nausea, tinnitus, hearing changes, or weight loss - Performs daily orthostatic measurements, which have not indicated orthostatic hypotension - Increased salt intake - Occasional headaches, often around menstrual cycle, with some blurry vision but no complete vision loss  Chest pain and cardiopulmonary symptoms - Recurrent left chest pain present since 2018, previously resolved but recently recurred - Described as a burning, radiating sensation at the lateral left breast radiating medially across the chest - Pain deep (beneath the breast), improves with touch - No shortness of breath except for one episode upon waking on Monday evening - Scratchy throat present - No palpitations or atrial fibrillation - Highest recorded heart rate is 112 bpm  Hematologic findings - Previous laboratory results in August showed elevated MCV and MCH with normal hemoglobin  Breast cancer surveillance - Family history of breast cancer - Performs regular self-breast examinations - Mammogram completed within the last year      Physical Exam   Physical Exam Vitals reviewed.  Constitutional:      General: She is not in acute distress.    Appearance: Normal appearance. She is well-developed. She is not ill-appearing.  HENT:     Head: Normocephalic and atraumatic.  Cardiovascular:     Rate and Rhythm: Normal rate and regular rhythm.     Pulses: Normal  pulses.     Heart sounds: Normal heart sounds. No murmur heard.    No friction rub. No gallop.  Pulmonary:     Effort: Pulmonary effort is normal. No respiratory distress.     Breath sounds: Normal breath sounds. No wheezing.  Skin:    General: Skin is warm and dry.  Neurological:     Mental Status: She is alert and oriented to person, place, and time.  Psychiatric:        Mood and Affect: Mood normal.        Behavior: Behavior normal.        Thought Content: Thought content normal.        Judgment: Judgment normal.    Assessment & Plan   Problem List Items Addressed This Visit   None Visit Diagnoses       Chest pain, unspecified type    -  Primary   Relevant Orders   EKG 12-Lead   Troponin T   D-dimer, quantitative   LONG TERM MONITOR (3-14 DAYS)   DG Chest 2 View   TSH     Dizziness       Relevant Orders   EKG 12-Lead   Troponin T   D-dimer, quantitative   LONG TERM MONITOR (3-14 DAYS)   DG Chest 2 View   TSH   Orthostatic vital signs   MR Brain Wo Contrast   Ambulatory referral to Neurology     Postural dizziness with presyncope          Assessment and Plan    Dizziness Persistent morning dizziness, resolving by afternoon. No orthostatic hypotension, nausea,  tinnitus, or hearing changes. Presentation atypical for vertigo given not associated with position changes and resolution of symptoms around lunch each day. - Order MRI brain without contrast to rule out central causes. - Consider vestibular training and medications like meclizine if symptoms persist. - Refer to neurology if dizziness persists after initial workup.  Chest pain Intermittent burning, radiating left chest pain with musculoskeletal component. No palpitations or significant shortness of breath. Differential includes musculoskeletal pain and cardiac causes. - In office EKG showing normal sinus rhythm, normal rate, normal axis. - Order chest x-ray to evaluate for pulmonary causes. - Order  long-term heart monitor for 7 days to assess for cardiac arrhythmias. - Consider echocardiogram if indicated by monitor results. - Order additional blood work including troponin and D-dimer.  Follow up   Return if symptoms worsen or fail to improve. __________________________________ Zada FREDRIK Palin, DNP, APRN, FNP-BC Primary Care and Sports Medicine [facility] [place]

## 2024-09-05 ENCOUNTER — Ambulatory Visit: Admitting: Medical-Surgical

## 2024-09-05 ENCOUNTER — Ambulatory Visit: Payer: Self-pay | Admitting: Medical-Surgical

## 2024-09-05 LAB — TSH: TSH: 0.716 u[IU]/mL (ref 0.450–4.500)

## 2024-09-05 LAB — TROPONIN T: Troponin T (Highly Sensitive): <6 ng/L (ref 0–14)

## 2024-09-05 LAB — D-DIMER, QUANTITATIVE: D-DIMER: 0.27 mg{FEU}/L (ref 0.00–0.49)

## 2024-09-08 ENCOUNTER — Ambulatory Visit

## 2024-09-08 DIAGNOSIS — H538 Other visual disturbances: Secondary | ICD-10-CM | POA: Diagnosis not present

## 2024-09-08 DIAGNOSIS — R079 Chest pain, unspecified: Secondary | ICD-10-CM | POA: Diagnosis not present

## 2024-09-08 DIAGNOSIS — R42 Dizziness and giddiness: Secondary | ICD-10-CM

## 2024-09-18 ENCOUNTER — Ambulatory Visit: Admitting: Medical-Surgical

## 2024-10-14 ENCOUNTER — Telehealth: Admitting: Physician Assistant

## 2024-10-14 DIAGNOSIS — M79609 Pain in unspecified limb: Secondary | ICD-10-CM

## 2024-10-14 DIAGNOSIS — M5441 Lumbago with sciatica, right side: Secondary | ICD-10-CM

## 2024-10-14 MED ORDER — PREDNISONE 10 MG (21) PO TBPK: ORAL_TABLET | ORAL | 0 refills | Status: AC

## 2024-10-14 NOTE — Progress Notes (Signed)
 We are sorry that you are not feeling well.  Here is how we plan to help!  Based on what you have shared with me it looks like you mostly have acute back pain.  Acute back pain is defined as musculoskeletal pain that can resolve in 1-3 weeks with conservative treatment.  I have prescribed a prednisone  pack to take as directed. Some patients experience stomach irritation or in increased heartburn with anti-inflammatory drugs.    Back pain is very common.  The pain often gets better over time.  The cause of back pain is usually not dangerous.  Most people can learn to manage their back pain on their own.  Home Care Stay active.  Start with short walks on flat ground if you can.  Try to walk farther each day. Do not sit, drive or stand in one place for more than 30 minutes.  Do not stay in bed. Do not avoid exercise or work.  Activity can help your back heal faster. Be careful when you bend or lift an object.  Bend at your knees, keep the object close to you, and do not twist. Sleep on a firm mattress.  Lie on your side, and bend your knees.  If you lie on your back, put a pillow under your knees. Only take medicines as told by your doctor. Put ice on the injured area. Put ice in a plastic bag Place a towel between your skin and the bag Leave the ice on for 15-20 minutes, 3-4 times a day for the first 2-3 days. 210 After that, you can switch between ice and heat packs. Ask your doctor about back exercises or massage. Avoid feeling anxious or stressed.  Find good ways to deal with stress, such as exercise.  Get Help Right Way If: Your pain does not go away with rest or medicine. Your pain does not go away in 1 week. You have new problems. You do not feel well. The pain spreads into your legs. You cannot control when you poop (bowel movement) or pee (urinate) You feel sick to your stomach (nauseous) or throw up (vomit) You have belly (abdominal) pain. You feel like you may pass out  (faint). If you develop a fever.  Make Sure you: Understand these instructions. Will watch your condition Will get help right away if you are not doing well or get worse.  Your e-visit answers were reviewed by a board certified advanced clinical practitioner to complete your personal care plan.  Depending on the condition, your plan could have included both over the counter or prescription medications.  If there is a problem please reply  once you have received a response from your provider.  Your safety is important to us .  If you have drug allergies check your prescription carefully.    You can use MyChart to ask questions about today's visit, request a non-urgent call back, or ask for a work or school excuse for 24 hours related to this e-Visit. If it has been greater than 24 hours you will need to follow up with your provider, or enter a new e-Visit to address those concerns.  You will get an e-mail in the next two days asking about your experience.  I hope that your e-visit has been valuable and will speed your recovery. Thank you for using e-visits.   I have spent 5 minutes in review of e-visit questionnaire, review and updating patient chart, medical decision making and response to patient.   Olivia Sutton  Velma Lunger, PA-C

## 2024-10-14 NOTE — Progress Notes (Signed)
 Message sent to patient requesting further input regarding current symptoms. Awaiting patient response.

## 2024-10-26 ENCOUNTER — Other Ambulatory Visit (HOSPITAL_COMMUNITY): Payer: Self-pay

## 2024-11-11 DIAGNOSIS — R42 Dizziness and giddiness: Secondary | ICD-10-CM | POA: Diagnosis not present

## 2024-11-11 DIAGNOSIS — R079 Chest pain, unspecified: Secondary | ICD-10-CM | POA: Diagnosis not present

## 2024-11-13 DIAGNOSIS — R079 Chest pain, unspecified: Secondary | ICD-10-CM

## 2024-11-13 DIAGNOSIS — R42 Dizziness and giddiness: Secondary | ICD-10-CM | POA: Diagnosis not present

## 2025-07-30 ENCOUNTER — Encounter: Admitting: Medical-Surgical
